# Patient Record
Sex: Female | Born: 1955 | Race: White | Hispanic: No | Marital: Married | Smoking: Former smoker
Health system: Southern US, Community
[De-identification: ages and names within clinical notes are randomized; demographics above are authoritative.]

## PROBLEM LIST (undated history)

## (undated) DIAGNOSIS — R42 Dizziness and giddiness: Secondary | ICD-10-CM

## (undated) DIAGNOSIS — Z720 Tobacco use: Secondary | ICD-10-CM

## (undated) DIAGNOSIS — I10 Essential (primary) hypertension: Secondary | ICD-10-CM

## (undated) DIAGNOSIS — R011 Cardiac murmur, unspecified: Secondary | ICD-10-CM

## (undated) DIAGNOSIS — I00 Rheumatic fever without heart involvement: Secondary | ICD-10-CM

## (undated) DIAGNOSIS — I341 Nonrheumatic mitral (valve) prolapse: Secondary | ICD-10-CM

## (undated) DIAGNOSIS — J449 Chronic obstructive pulmonary disease, unspecified: Secondary | ICD-10-CM

## (undated) DIAGNOSIS — N879 Dysplasia of cervix uteri, unspecified: Secondary | ICD-10-CM

## (undated) DIAGNOSIS — E079 Disorder of thyroid, unspecified: Secondary | ICD-10-CM

## (undated) HISTORY — DX: Rheumatic fever without heart involvement: I00

## (undated) HISTORY — PX: BREAST BIOPSY: SHX20

## (undated) HISTORY — DX: Tobacco use: Z72.0

## (undated) HISTORY — DX: Nonrheumatic mitral (valve) prolapse: I34.1

## (undated) HISTORY — DX: Disorder of thyroid, unspecified: E07.9

## (undated) HISTORY — PX: CYSTECTOMY: SUR359

## (undated) HISTORY — DX: Essential (primary) hypertension: I10

## (undated) HISTORY — DX: Dysplasia of cervix uteri, unspecified: N87.9

## (undated) HISTORY — DX: Dizziness and giddiness: R42

## (undated) HISTORY — DX: Cardiac murmur, unspecified: R01.1

## (undated) HISTORY — DX: Chronic obstructive pulmonary disease, unspecified: J44.9

---
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
## 2015-02-25 ENCOUNTER — Ambulatory Visit (INDEPENDENT_AMBULATORY_CARE_PROVIDER_SITE_OTHER): Payer: Managed Care, Other (non HMO) | Admitting: Primary Care

## 2015-02-25 ENCOUNTER — Encounter (INDEPENDENT_AMBULATORY_CARE_PROVIDER_SITE_OTHER): Payer: Self-pay

## 2015-02-25 ENCOUNTER — Encounter: Payer: Self-pay | Admitting: Primary Care

## 2015-02-25 VITALS — BP 126/80 | HR 106 | Temp 98.7°F | Ht 66.5 in | Wt 159.0 lb

## 2015-02-25 DIAGNOSIS — Z720 Tobacco use: Secondary | ICD-10-CM

## 2015-02-25 DIAGNOSIS — R5383 Other fatigue: Secondary | ICD-10-CM | POA: Insufficient documentation

## 2015-02-25 DIAGNOSIS — E039 Hypothyroidism, unspecified: Secondary | ICD-10-CM | POA: Diagnosis not present

## 2015-02-25 DIAGNOSIS — I10 Essential (primary) hypertension: Secondary | ICD-10-CM | POA: Diagnosis not present

## 2015-02-25 DIAGNOSIS — Z0000 Encounter for general adult medical examination without abnormal findings: Secondary | ICD-10-CM | POA: Diagnosis not present

## 2015-02-25 DIAGNOSIS — G4733 Obstructive sleep apnea (adult) (pediatric): Secondary | ICD-10-CM

## 2015-02-25 DIAGNOSIS — H8110 Benign paroxysmal vertigo, unspecified ear: Secondary | ICD-10-CM

## 2015-02-25 MED ORDER — LISINOPRIL 20 MG PO TABS: 20.0000 mg | ORAL_TABLET | Freq: Every day | ORAL | Status: DC

## 2015-02-25 MED ORDER — LEVOTHYROXINE SODIUM 25 MCG PO TABS: 25.0000 ug | ORAL_TABLET | Freq: Every day | ORAL | Status: DC

## 2015-02-25 MED ORDER — BUPROPION HCL ER (SR) 150 MG PO TB12: 150.0000 mg | ORAL_TABLET | Freq: Two times a day (BID) | ORAL | Status: DC

## 2015-02-25 NOTE — Progress Notes (Signed)
Pre visit review using our clinic review tool, if applicable. No additional management support is needed unless otherwise documented below in the visit note. 

## 2015-02-25 NOTE — Assessment & Plan Note (Signed)
Currently smoking [employer] Cigars and has since April 2015. She plans on quitting once she returns from her upcoming vacation. Refilled Wellbutrin to assist with this process. Spent 5 min discussing importance of quitting. Will continue to monitor.

## 2015-02-25 NOTE — Assessment & Plan Note (Signed)
I suspect this is OSA. She's been notified of this in the past. Will confirm diagnosis once she has completed her sleep study. Sleep study referral made.

## 2015-02-25 NOTE — Assessment & Plan Note (Signed)
Stable without recent dizziness. Uses Meclizine PRN. Will refer to Vestibular rehab if symptoms worsen or become bothersome.

## 2015-02-25 NOTE — Patient Instructions (Signed)
You will be contacted regarding your referrals for the sleep study and for your mammogram. Continue your efforts to quit smoking. Schedule a fasting physical with me in the next 1-2 months. Welcome to [facility]!

## 2015-02-25 NOTE — Assessment & Plan Note (Signed)
Stable on Levothyroxine 54mcg without symptoms. Will obtain old records and new TSH at her upcoming physical.

## 2015-02-25 NOTE — Progress Notes (Signed)
Subjective:    Patient ID: Shaneisha Burkel, female    DOB: Apr 27, 1956, 59 y.o.   MRN: [id number]  HPI  Ms. Pendelton is a 59 year old female who presents today to establish care and discuss the problems mentioned below. Will obtain old records. She moved here from [place] August of 2015 due to a job relocation.    1) Hypothyroidism: Diagnosed in 2003, and taking levothyroxine 54mcg. She's been on this dose consistently for years. Her last TSH was checked during her physical last year, thyroid remains intact anatomically.  2) Hypertension: Diagnosed about 12-15 years ago. She's been on lisinopril 20mg  consistently for numerous years without complications. Eats healthy diet mostly consisting of lean meats, fruits and vegetables. She and her husband prepare most meals and she rarely eats fast food. She is currently not exercising. BP today listed below.  BP Readings from Last 3 Encounters:  02/25/15 126/80   3) Tobacco abuse: Smoked 1-2 PPD of cigarettes for 30 years, and quit 12 years ago. She started smoking swisher sweets cigars since April 2015 during her transition from [place] to [facility]. She is currently taking Wellbutrin SR to help with smoking cessation. She plans on quitting once she returns from her vacation in the [place].   4) Vertigo: Has had 2 episodes to date. She was treated at an Urgent Care with Meclizine and referred to ENT who she saw once. Her symptoms are mild to date and have not progressed.    5) Tiredness: Reports feeling tired most of the day, and will wake up in the middle of the night. She snores and was supposed to go for a sleep study prior to moving to [place], but was unable to do so. She is now open to a sleep study.  Review of Systems  Constitutional: Positive for fatigue. Negative for unexpected weight change.  HENT: Negative for rhinorrhea.   Respiratory: Positive for cough. Negative for shortness of breath.        Chronic cough x 2 months. Last chest xray one  year ago.  Cardiovascular: Negative for chest pain.  Gastrointestinal: Negative for diarrhea and constipation.  Genitourinary: Negative for dysuria and frequency.  Musculoskeletal: Negative for myalgias and arthralgias.  Skin: Negative for rash.  Neurological: Negative for dizziness and headaches.       2 episodes of vertigo.  Hematological: Negative for adenopathy.  Psychiatric/Behavioral:       Denies anxiety or depression       Past Medical History  Diagnosis Date  . Heart murmur     no longer when she has younger  . Hypertension   . Rheumatic fever     59 years old  . Thyroid disease     History   Social History  . Marital Status: Married    Spouse Name: N/A  . Number of Children: N/A  . Years of Education: N/A   Occupational History  . Not on file.   Social History Main Topics  . Smoking status: Current Every Day Smoker  . Smokeless tobacco: Not on file  . Alcohol Use: No  . Drug Use: No  . Sexual Activity: Not on file   Other Topics Concern  . Not on file   Social History Narrative   Married.   No children.   Moved from [place] last August for job.   Vacations in [place] every year.   Enjoys reading in her free time.    Past Surgical History  Procedure Laterality Date  .  Tonsillectomy  1972    Family History  Problem Relation Age of Onset  . Cancer Father     Lung CA  . Arthritis Maternal Grandmother   . Hypothyroidism Sister   . Glaucoma Mother   . Hypothyroidism Mother     No Known Allergies  No current outpatient prescriptions on file prior to visit.   No current facility-administered medications on file prior to visit.    BP 126/80 mmHg  Pulse 106  Temp(Src) 98.7 F (37.1 C) (Oral)  Ht 5' 6.5" (1.689 m)  Wt 159 lb (72.122 kg)  BMI 25.28 kg/m2  SpO2 95%    Objective:   Physical Exam  Constitutional: She is oriented to person, place, and time. She appears well-developed.  HENT:  Head: Normocephalic.  Right Ear: External ear  normal.  Left Ear: External ear normal.  Nose: Nose normal.  Mouth/Throat: Oropharynx is clear and moist.  Eyes: Conjunctivae and EOM are normal. Pupils are equal, round, and reactive to light.  Neck: Neck supple. No thyromegaly present.  Cardiovascular: Regular rhythm and normal heart sounds.   Pulmonary/Chest: Effort normal and breath sounds normal. She has no wheezes. She has no rales.  Abdominal: Soft. Bowel sounds are normal. There is no tenderness.  Lymphadenopathy:    She has no cervical adenopathy.  Neurological: She is alert and oriented to person, place, and time. No cranial nerve deficit.  Skin: Skin is warm and dry.  Psychiatric: She has a normal mood and affect.          Assessment & Plan:  5 min spent discussing tobacco cessation

## 2015-02-25 NOTE — Assessment & Plan Note (Signed)
Stable on Lisinopril 20mg . Eats a healthy diet, discussed exercise. Will continue to monitor. Will obtain BMET from prior PCP.

## 2015-02-26 ENCOUNTER — Telehealth: Payer: Self-pay | Admitting: Primary Care

## 2015-02-26 NOTE — Telephone Encounter (Signed)
emmi emailed °

## 2015-02-26 NOTE — Telephone Encounter (Signed)
emmi mailed  °

## 2015-03-17 ENCOUNTER — Encounter: Payer: Self-pay | Admitting: Primary Care

## 2015-04-01 ENCOUNTER — Encounter: Payer: Self-pay | Admitting: Internal Medicine

## 2015-04-06 ENCOUNTER — Ambulatory Visit (INDEPENDENT_AMBULATORY_CARE_PROVIDER_SITE_OTHER): Payer: Managed Care, Other (non HMO) | Admitting: Primary Care

## 2015-04-06 ENCOUNTER — Encounter: Payer: Self-pay | Admitting: Primary Care

## 2015-04-06 VITALS — BP 118/72 | HR 94 | Temp 98.0°F | Ht 67.0 in | Wt 158.8 lb

## 2015-04-06 DIAGNOSIS — I10 Essential (primary) hypertension: Secondary | ICD-10-CM

## 2015-04-06 DIAGNOSIS — Z0000 Encounter for general adult medical examination without abnormal findings: Secondary | ICD-10-CM

## 2015-04-06 DIAGNOSIS — Z720 Tobacco use: Secondary | ICD-10-CM | POA: Diagnosis not present

## 2015-04-06 DIAGNOSIS — Z23 Encounter for immunization: Secondary | ICD-10-CM | POA: Diagnosis not present

## 2015-04-06 DIAGNOSIS — E039 Hypothyroidism, unspecified: Secondary | ICD-10-CM

## 2015-04-06 DIAGNOSIS — E785 Hyperlipidemia, unspecified: Secondary | ICD-10-CM | POA: Insufficient documentation

## 2015-04-06 LAB — CBC WITH DIFFERENTIAL/PLATELET
BASOS ABS: 0 10*3/uL (ref 0.0–0.1)
Basophils Relative: 0.3 % (ref 0.0–3.0)
EOS ABS: 0.1 10*3/uL (ref 0.0–0.7)
Eosinophils Relative: 1.2 % (ref 0.0–5.0)
HCT: 42.7 % (ref 36.0–46.0)
Hemoglobin: 14.3 g/dL (ref 12.0–15.0)
LYMPHS PCT: 16.2 % (ref 12.0–46.0)
Lymphs Abs: 1.8 10*3/uL (ref 0.7–4.0)
MCHC: 33.6 g/dL (ref 30.0–36.0)
MCV: 90.9 fl (ref 78.0–100.0)
MONOS PCT: 3 % (ref 3.0–12.0)
Monocytes Absolute: 0.3 10*3/uL (ref 0.1–1.0)
NEUTROS ABS: 8.8 10*3/uL — AB (ref 1.4–7.7)
Neutrophils Relative %: 79.3 % — ABNORMAL HIGH (ref 43.0–77.0)
PLATELETS: 359 10*3/uL (ref 150.0–400.0)
RBC: 4.7 Mil/uL (ref 3.87–5.11)
RDW: 14.6 % (ref 11.5–15.5)
WBC: 11.1 10*3/uL — AB (ref 4.0–10.5)

## 2015-04-06 LAB — LIPID PANEL
Cholesterol: 225 mg/dL — ABNORMAL HIGH (ref 0–200)
HDL: 49 mg/dL (ref >39.00)
LDL Cholesterol: 149 mg/dL — ABNORMAL HIGH (ref 0–99)
NonHDL: 176
TRIGLYCERIDES: 133 mg/dL (ref 0.0–149.0)
Total CHOL/HDL Ratio: 5
VLDL: 26.6 mg/dL (ref 0.0–40.0)

## 2015-04-06 LAB — COMPREHENSIVE METABOLIC PANEL
ALK PHOS: 82 U/L (ref 39–117)
ALT: 14 U/L (ref 0–35)
AST: 15 U/L (ref 0–37)
Albumin: 4.2 g/dL (ref 3.5–5.2)
BILIRUBIN TOTAL: 0.3 mg/dL (ref 0.2–1.2)
BUN: 11 mg/dL (ref 6–23)
CO2: 23 mEq/L (ref 19–32)
CREATININE: 0.72 mg/dL (ref 0.40–1.20)
Calcium: 9.8 mg/dL (ref 8.4–10.5)
Chloride: 107 mEq/L (ref 96–112)
GFR: 88.07 mL/min (ref >60.00)
GLUCOSE: 97 mg/dL (ref 70–99)
Potassium: 4.7 mEq/L (ref 3.5–5.1)
SODIUM: 140 meq/L (ref 135–145)
Total Protein: 7.2 g/dL (ref 6.0–8.3)

## 2015-04-06 LAB — HEMOGLOBIN A1C: Hgb A1c MFr Bld: 5.8 % (ref 4.6–6.5)

## 2015-04-06 LAB — TSH: TSH: 0.83 u[IU]/mL (ref 0.35–4.50)

## 2015-04-06 NOTE — Patient Instructions (Signed)
Complete lab work prior to leaving today. I will notify you of your results. Your cough is likely related to your lisinopril. We can wean you off of this medication. Start taking 1/2 tablet by mouth once daily for one week then stop. You will be contacted regarding your referral to the Dentist and Optometrist.  Please let us know if you have not heard back within one week.  Please schedule your mammogram. Follow up in one month for re-evaluation of your blood pressure. Call me if you develop chest pain, headaches, shortness of breath. It was great to see you!

## 2015-04-06 NOTE — Addendum Note (Signed)
Addended by: Marchia Bond on: 04/06/2015 04:33 PM   Modules accepted: Miquel Dunn

## 2015-04-06 NOTE — Progress Notes (Signed)
Subjective:    Patient ID: Gloria Neal, female    DOB: December 19, 1955, 59 y.o.   MRN: [id number]  HPI  Gloria Neal is a 59 year old female who presents today for complete physical.  Immunizations: -Tetanus: Last TDAP was 2008. -Influenza: Did not have the flu shot last season -Pneumonia: Has never had.    Diet: Overall healthy diet, prepare meals at home mostly. Baked/grilled lean meats, fruits and vegetables. Drinks water. Rare consumption of fast food. Exercise: Currently not exercising. Eye exam: Last one was over 2 years ago in [place]. Would like to establish in [place]. Dental exam: Last visit was in June/July 2015 in [place]. Would like to establish in [place]. Colonoscopy: Completed in 06/2012. Repeat in 10 years. Dexa: Believes it to have been 5-6 years ago. Currently does not take Calcium or vitamin D.  Pap Smear: Last PAP was two years ago. Due in 2017. Mammogram: Referral made last visit. She is working on setting the appointment.  1) Tobacco abuse: Currently smokes [employer] Cigars. Smokes 2-3 per day. She and her husband are talking about quitting. She is taking Wellbutrin SR 150 daily. Denies shortness of breath, hemoptysis. Chest xray one year ago was negative.  2) Cough: Present for three months, non-productive, dry/tickle cough that is persistent. Chest xray one year ago and was normal. Denies hemoptysis.   Review of Systems  Constitutional: Negative for fatigue and unexpected weight change.  HENT: Negative for congestion, rhinorrhea and sore throat.   Respiratory: Positive for cough. Negative for shortness of breath.   Cardiovascular: Negative for chest pain and leg swelling.  Gastrointestinal: Negative for diarrhea, constipation and blood in stool.  Genitourinary: Negative for dysuria, frequency and difficulty urinating.  Allergic/Immunologic: Positive for environmental allergies.  Neurological: Negative for dizziness, numbness and headaches.  Hematological: Negative for  adenopathy.  Psychiatric/Behavioral:       Denies concerns for anxiety or depression       Past Medical History  Diagnosis Date  . Heart murmur     no longer when she has younger  . Hypertension   . Rheumatic fever     60 years old  . Thyroid disease   . Tobacco abuse   . Vertigo   . Mitral valve prolapse   . Cervical dysplasia     History   Social History  . Marital Status: Married    Spouse Name: N/A  . Number of Children: N/A  . Years of Education: N/A   Occupational History  . Not on file.   Social History Main Topics  . Smoking status: Current Every Day Smoker  . Smokeless tobacco: Not on file  . Alcohol Use: No  . Drug Use: No  . Sexual Activity: Not on file   Other Topics Concern  . Not on file   Social History Narrative   Married.   No children.   Moved from [place] last August for job.   Vacations in [place] every year.   Enjoys reading in her free time.    Past Surgical History  Procedure Laterality Date  . Tonsillectomy  1972    Family History  Problem Relation Age of Onset  . Cancer Father     Lung CA  . Arthritis Maternal Grandmother   . Hypothyroidism Sister   . Glaucoma Mother   . Hypothyroidism Mother     No Known Allergies  Current Outpatient Prescriptions on File Prior to Visit  Medication Sig Dispense Refill  . buPROPion [facility]  SR) 150 MG 12 hr tablet Take 1 tablet (150 mg total) by mouth 2 (two) times daily. 60 tablet 3  . levothyroxine (SYNTHROID, LEVOTHROID) 25 MCG tablet Take 1 tablet (25 mcg total) by mouth daily before breakfast. 30 tablet 5  . lisinopril (PRINIVIL,ZESTRIL) 20 MG tablet Take 1 tablet (20 mg total) by mouth daily. 30 tablet 5   No current facility-administered medications on file prior to visit.    BP 118/72 mmHg  Pulse 94  Temp(Src) 98 F (36.7 C) (Oral)  Ht 5\' 7"  (1.702 m)  Wt 158 lb 12.8 oz (72.031 kg)  BMI 24.87 kg/m2  SpO2 98%    Objective:   Physical Exam  Constitutional: She is oriented  to person, place, and time. She appears well-developed.  HENT:  Right Ear: Tympanic membrane and ear canal normal.  Left Ear: Tympanic membrane and ear canal normal.  Nose: Nose normal.  Mouth/Throat: Oropharynx is clear and moist.  Eyes: Conjunctivae and EOM are normal. Pupils are equal, round, and reactive to light.  Neck: Neck supple. No thyromegaly present.  Cardiovascular: Normal rate.   Pulmonary/Chest: Effort normal and breath sounds normal.  Abdominal: Soft. Bowel sounds are normal. She exhibits no mass. There is no tenderness.  Musculoskeletal: Normal range of motion.  Lymphadenopathy:    She has no cervical adenopathy.  Neurological: She is alert and oriented to person, place, and time. She has normal reflexes. No cranial nerve deficit. Coordination normal.  Skin: Skin is warm and dry. No rash noted.  Psychiatric: She has a normal mood and affect.          Assessment & Plan:  Discussed the importance of tobacco cessation for 5 min during visit.

## 2015-04-06 NOTE — Assessment & Plan Note (Signed)
Taking Levothyroxine 25 mcg. TSH today.

## 2015-04-06 NOTE — Assessment & Plan Note (Addendum)
Still smoking swisher sweets cigars. She and her husband are talking about quitting. Discussed importance of quitting, especially given her FH of lung cancer. Cough present for 3 months, likely due to lisinopril. If still present after removal of medication, will consider chest xray. Will continue to monitor and discuss.

## 2015-04-06 NOTE — Assessment & Plan Note (Signed)
Taking lisinopril 20mg  since early 2000's. Development of dry, tickle, cough that has been persistent for 3 months.  BP stable today, she follows a healthy diet. Will wean off to determine the cause of cough and to monitor BP off medication. Follow up in one month for re-evaluation.

## 2015-04-06 NOTE — Assessment & Plan Note (Signed)
Tetanus up-to-date. Needs pneumonia vaccine due to risk factors of smoking. Administered Pneumovax. Overall healthy diet. Is thinking about quitting smoking. Pap due next year. Working on mammogram appointment, referral made last visit. Encouraged intake of calcium with vitamin D. Needs Dexa scan due to r/f of smoking.

## 2015-04-06 NOTE — Progress Notes (Signed)
Pre visit review using our clinic review tool, if applicable. No additional management support is needed unless otherwise documented below in the visit note. 

## 2015-04-07 ENCOUNTER — Encounter: Payer: Self-pay | Admitting: *Deleted

## 2015-04-09 ENCOUNTER — Telehealth: Payer: Self-pay | Admitting: Primary Care

## 2015-04-09 ENCOUNTER — Other Ambulatory Visit: Payer: Self-pay | Admitting: Primary Care

## 2015-04-09 DIAGNOSIS — Z780 Asymptomatic menopausal state: Secondary | ICD-10-CM

## 2015-04-09 NOTE — Telephone Encounter (Signed)
Gloria Neal i called [facility] to schedule Gloria Neal's bone density They need it changed to post menopausal or vit d dificiency Can you change order

## 2015-04-09 NOTE — Telephone Encounter (Signed)
Done, thanks

## 2015-04-09 NOTE — Telephone Encounter (Signed)
Left message asking pt to call office Please let pt know her mammogram and bone density is Monday 04/27/15 [facility] arrive @ 2:40 appointment at 3  Pt needs to get previous records sent to [facility] [address] [place] [address] Phone # [phone]

## 2015-04-10 NOTE — Telephone Encounter (Signed)
Left message asking pt to call office  °

## 2015-04-13 NOTE — Telephone Encounter (Signed)
Left message asking pt to call office  °

## 2015-04-14 NOTE — Telephone Encounter (Signed)
Called and spoken to patient. Notified her of the apt of mammogram and bone density. Patient verbalized understanding.

## 2015-04-14 NOTE — Addendum Note (Signed)
Addended by: Jacqualin Combes on: 04/14/2015 09:11 AM   Modules accepted: Orders

## 2015-04-16 ENCOUNTER — Ambulatory Visit (INDEPENDENT_AMBULATORY_CARE_PROVIDER_SITE_OTHER): Payer: Managed Care, Other (non HMO) | Admitting: Pulmonary Disease

## 2015-04-16 ENCOUNTER — Encounter (INDEPENDENT_AMBULATORY_CARE_PROVIDER_SITE_OTHER): Payer: Self-pay

## 2015-04-16 ENCOUNTER — Encounter: Payer: Self-pay | Admitting: Pulmonary Disease

## 2015-04-16 VITALS — BP 142/86 | HR 110 | Ht 67.0 in | Wt 159.6 lb

## 2015-04-16 DIAGNOSIS — G4733 Obstructive sleep apnea (adult) (pediatric): Secondary | ICD-10-CM

## 2015-04-16 DIAGNOSIS — Z720 Tobacco use: Secondary | ICD-10-CM | POA: Diagnosis not present

## 2015-04-16 NOTE — Patient Instructions (Signed)
Will arrange for sleep study Will call to arrange for follow up after sleep study reviewed 

## 2015-04-16 NOTE — Progress Notes (Deleted)
   Subjective:    Patient ID: Gloria Neal, female    DOB: [birth year], 59 y.o.   MRN: [id number]  HPI    Review of Systems  Constitutional: Negative for fever and unexpected weight change.  HENT: Negative for congestion, dental problem, ear pain, nosebleeds, postnasal drip, rhinorrhea, sinus pressure, sneezing, sore throat and trouble swallowing.   Eyes: Negative for redness and itching.  Respiratory: Positive for cough. Negative for chest tightness, shortness of breath and wheezing.   Cardiovascular: Negative for palpitations and leg swelling.  Gastrointestinal: Negative for nausea and vomiting.  Genitourinary: Negative for dysuria.  Musculoskeletal: Negative for joint swelling.  Skin: Negative for rash.  Neurological: Negative for headaches.  Hematological: Does not bruise/bleed easily.  Psychiatric/Behavioral: Negative for dysphoric mood. The patient is not nervous/anxious.        Objective:   Physical Exam        Assessment & Plan:

## 2015-04-16 NOTE — Progress Notes (Signed)
Chief Complaint  Patient presents with  . SLEEP CONSULT    Referred by [name] NP. Sleep study in [place] several years ago(10+ yrs). Pt does not have copy of this. Epworth Score: 7    History of Present Illness: Gloria Neal is a 59 y.o. female for evaluation of sleep problems.  She was diagnosed with sleep apnea while living in [place] about 10 yrs ago.  She was advised to f/u with ENT, but never did.  She has noticed more trouble with staying asleep and daytime fatigue.  She is a loud snorer, and her husband sleeps in a separate room.  She can't sleep on her back.  Her mouth gets very dry at night and she has to wake up to drink water.  She goes to sleep at 9 pm.  She falls asleep quickly.  She wakes up 5 to 6 times to use the bathroom.  She gets out of bed at 515 am.  She feels tired in the morning.  She sometimes gets morning headache.  She does not use anything to help her fall sleep.  She drinks one cup of coffee in the morning.  She denies sleep walking, sleep talking, bruxism, or nightmares.  There is no history of restless legs.  She denies sleep hallucinations, sleep paralysis, or cataplexy.  The Epworth score is 7 out of 24.  Gloria Neal  has a past medical history of Heart murmur; Hypertension; Rheumatic fever; Thyroid disease; Tobacco abuse; Vertigo; Mitral valve prolapse; and Cervical dysplasia.  Gloria Neal  has past surgical history that includes Tonsillectomy (1972) and Cystectomy.  Prior to Admission medications   Medication Sig Start Date End Date Taking? Authorizing Provider  buPROPion (WELLBUTRIN SR) 150 MG 12 hr tablet Take 1 tablet (150 mg total) by mouth 2 (two) times daily. 02/25/15  Yes Pleas [name], NP  levothyroxine (SYNTHROID, LEVOTHROID) 25 MCG tablet Take 1 tablet (25 mcg total) by mouth daily before breakfast. 02/25/15  Yes Pleas [name], NP  lisinopril (PRINIVIL,ZESTRIL) 20 MG tablet Take 1 tablet (20 mg total) by mouth  daily. Patient not taking: Reported on 04/16/2015 02/25/15   Pleas [name], NP    No Known Allergies  Her family history includes Arthritis in her maternal grandmother; Cancer in her father; Glaucoma in her mother; Hypothyroidism in her mother and sister.  She  reports that she has been smoking Cigars.  She does not have any smokeless tobacco history on file. She reports that she drinks alcohol. She reports that she does not use illicit drugs.   Review of Systems  Constitutional: Negative for fever and unexpected weight change.  HENT: Negative for congestion, dental problem, ear pain, nosebleeds, postnasal drip, rhinorrhea, sinus pressure, sneezing, sore throat and trouble swallowing.   Eyes: Negative for redness and itching.  Respiratory: Positive for cough. Negative for chest tightness, shortness of breath and wheezing.   Cardiovascular: Negative for palpitations and leg swelling.  Gastrointestinal: Negative for nausea and vomiting.  Genitourinary: Negative for dysuria.  Musculoskeletal: Negative for joint swelling.  Skin: Negative for rash.  Neurological: Negative for headaches.  Hematological: Does not bruise/bleed easily.  Psychiatric/Behavioral: Negative for dysphoric mood. The patient is not nervous/anxious.    Physical Exam: Blood pressure 142/86, pulse 110, height 5\' 7"  (1.702 m), weight 159 lb 9.6 oz (72.394 kg), SpO2 96 %. Body mass index is 24.99 kg/(m^2).  General - No distress ENT - No sinus tenderness, no oral exudate, no LAN, no thyromegaly, TM clear,  pupils equal/reactive, MP 3, slight over bite Cardiac - s1s2 regular, no murmur, pulses symmetric Chest - No wheeze/rales/dullness, good air entry, normal respiratory excursion Back - No focal tenderness Abd - Soft, non-tender, no organomegaly, + bowel sounds Ext - No edema, no clubbing Neuro - Normal strength, cranial nerves intact Skin - No rashes Psych - Normal mood, and behavior  Discussion: She has snoring,  sleep disruption, and daytime sleepiness.  She has hx of HTN.  She has prior hx of OSA.  I am concerned she still has sleep apnea.  We discussed how sleep apnea can affect various health problems including risks for hypertension, cardiovascular disease, and diabetes.  We also discussed how sleep disruption can increase risks for accident, such as while driving.  Weight loss as a means of improving sleep apnea was also reviewed.  Additional treatment options discussed were CPAP therapy, oral appliance, and surgical intervention.   Assessment/plan:  Obstructive sleep apnea. Plan: - will arrange for in lab sleep study - if she still has sleep apnea, then oral appliance might be good option for her  Tobacco abuse. Plan: - encouraged her to continue with plan for smoking cessation with wellbutrin   Chesley Mires, M.D. Pager [phone]

## 2015-04-27 ENCOUNTER — Ambulatory Visit
Admission: RE | Admit: 2015-04-27 | Discharge: 2015-04-27 | Disposition: A | Discharge status: Home / Self Care | Payer: Managed Care, Other (non HMO) | Source: Ambulatory Visit | Attending: Primary Care | Admitting: Primary Care

## 2015-04-27 DIAGNOSIS — Z0000 Encounter for general adult medical examination without abnormal findings: Secondary | ICD-10-CM

## 2015-04-27 DIAGNOSIS — Z780 Asymptomatic menopausal state: Secondary | ICD-10-CM

## 2015-04-28 ENCOUNTER — Encounter: Payer: Self-pay | Admitting: *Deleted

## 2015-05-07 ENCOUNTER — Ambulatory Visit (INDEPENDENT_AMBULATORY_CARE_PROVIDER_SITE_OTHER): Payer: Managed Care, Other (non HMO) | Admitting: Primary Care

## 2015-05-07 ENCOUNTER — Encounter: Payer: Self-pay | Admitting: Primary Care

## 2015-05-07 VITALS — BP 126/82 | HR 87 | Temp 98.2°F | Ht 67.0 in | Wt 162.1 lb

## 2015-05-07 DIAGNOSIS — I10 Essential (primary) hypertension: Secondary | ICD-10-CM

## 2015-05-07 DIAGNOSIS — Z720 Tobacco use: Secondary | ICD-10-CM

## 2015-05-07 NOTE — Assessment & Plan Note (Signed)
She stopped smoking on 04/20/15. She is taking daily Wellbutrin which is helping with cravings. Commended her on her success! Will closely monitor.

## 2015-05-07 NOTE — Assessment & Plan Note (Signed)
Stable thus far off of Lisinopril. No cough present. Overall feels well with some headaches. One elevated reading during pulmonology consult. Will continue to closely monitor.   BP Readings from Last 3 Encounters:  05/07/15 126/82  04/16/15 142/86  04/06/15 118/72

## 2015-05-07 NOTE — Patient Instructions (Addendum)
Purchase a blood pressure cuff and check your blood pressure once every 3 days. Take note of your readings and notify me if you get readings consistently over 140/90. Healthy diet and exercise will help to keep your pressures maintained. Call me if your headaches worsen. Congratulations on smoking cessation!   Follow up in 2 months for blood pressure re-check.

## 2015-05-07 NOTE — Progress Notes (Signed)
Subjective:    Patient ID: Gloria Neal, female    DOB: [birth year], 59 y.o.   MRN: [id number]  HPI  Gloria Neal is a 59 year old female who presents today for follow up of hypertension. During last visit she was reporting a dry cough present for the past 3 months. She was taking lisinopril 20 mg since early 2000's. She was removed from this medication last visit to determine if this was the cause of her cough. Since last visit she's felt well with some headaches, but also needs a new glasses prescription. She's had one elevated reading at her pulmonary visit of 142/86. Her blood pressure in clinic today is stable. She has not experienced a cough since removal of lisinopril.  BP Readings from Last 3 Encounters:  05/07/15 126/82  04/16/15 142/86  04/06/15 118/72     2) Tobacco cessation: She stopped smoking swisher sweets on Monday May 16th at 3:30pm. She has not had a cigar or cigarette since. She continues to take her Wellbutrin daily which helps curve cravings. She's feeling good about her decision and is motivated to not smoke. She and her husband both quit together.  Review of Systems  Respiratory: Negative for cough and shortness of breath.   Cardiovascular: Negative for chest pain.  Neurological: Positive for headaches.       Past Medical History  Diagnosis Date  . Heart murmur     no longer when she has younger  . Hypertension   . Rheumatic fever     59 years old  . Thyroid disease   . Tobacco abuse   . Vertigo   . Mitral valve prolapse   . Cervical dysplasia     History   Social History  . Marital Status: Married    Spouse Name: N/A  . Number of Children: N/A  . Years of Education: N/A   Occupational History  . Accts Payable    Social History Main Topics  . Smoking status: Former Smoker    Types: Cigars    Quit date: 04/20/2015  . Smokeless tobacco: Not on file     Comment: 2 cigars a day  . Alcohol Use: 0.0 oz/week    0 Standard drinks or equivalent  per week     Comment: occassional  . Drug Use: No  . Sexual Activity: Not on file   Other Topics Concern  . Not on file   Social History Narrative   Married.   No children.   Moved from [place] last August for job.   Vacations in [place] every year.   Enjoys reading in her free time.    Past Surgical History  Procedure Laterality Date  . Tonsillectomy  1972  . Cystectomy      right brow and buttocks    Family History  Problem Relation Age of Onset  . Cancer Father     Lung CA  . Arthritis Maternal Grandmother   . Hypothyroidism Sister   . Glaucoma Mother   . Hypothyroidism Mother     No Known Allergies  Current Outpatient Prescriptions on File Prior to Visit  Medication Sig Dispense Refill  . buPROPion (WELLBUTRIN SR) 150 MG 12 hr tablet Take 1 tablet (150 mg total) by mouth 2 (two) times daily. 60 tablet 3  . levothyroxine (SYNTHROID, LEVOTHROID) 25 MCG tablet Take 1 tablet (25 mcg total) by mouth daily before breakfast. 30 tablet 5  . lisinopril (PRINIVIL,ZESTRIL) 20 MG tablet Take 1 tablet (20 mg total)  by mouth daily. 30 tablet 5   No current facility-administered medications on file prior to visit.    BP 126/82 mmHg  Pulse 87  Temp(Src) 98.2 F (36.8 C) (Oral)  Ht 5\' 7"  (1.702 m)  Wt 162 lb 1.9 oz (73.537 kg)  BMI 25.39 kg/m2  SpO2 98%    Objective:   Physical Exam  Cardiovascular: Normal rate and regular rhythm.   Pulmonary/Chest: Effort normal and breath sounds normal.  Skin: Skin is warm and dry.          Assessment & Plan:

## 2015-05-07 NOTE — Progress Notes (Signed)
Pre visit review using our clinic review tool, if applicable. No additional management support is needed unless otherwise documented below in the visit note. 

## 2015-05-08 ENCOUNTER — Encounter: Payer: Self-pay | Admitting: *Deleted

## 2015-06-26 ENCOUNTER — Encounter (HOSPITAL_BASED_OUTPATIENT_CLINIC_OR_DEPARTMENT_OTHER): Payer: Managed Care, Other (non HMO)

## 2015-07-07 ENCOUNTER — Encounter: Payer: Self-pay | Admitting: Primary Care

## 2015-07-07 ENCOUNTER — Ambulatory Visit (INDEPENDENT_AMBULATORY_CARE_PROVIDER_SITE_OTHER): Payer: Managed Care, Other (non HMO) | Admitting: Primary Care

## 2015-07-07 VITALS — BP 138/84 | HR 78 | Temp 98.1°F | Ht 67.0 in | Wt 167.8 lb

## 2015-07-07 DIAGNOSIS — Z720 Tobacco use: Secondary | ICD-10-CM

## 2015-07-07 DIAGNOSIS — R21 Rash and other nonspecific skin eruption: Secondary | ICD-10-CM | POA: Diagnosis not present

## 2015-07-07 DIAGNOSIS — I10 Essential (primary) hypertension: Secondary | ICD-10-CM | POA: Diagnosis not present

## 2015-07-07 MED ORDER — TRIAMCINOLONE ACETONIDE 0.1 % EX CREA: 1.0000 "application " | TOPICAL_CREAM | Freq: Two times a day (BID) | CUTANEOUS | Status: DC

## 2015-07-07 NOTE — Progress Notes (Signed)
Subjective:    Patient ID: Gloria Neal, female    DOB: 05-Jan-1956, 59 y.o.   MRN: [id number]  HPI  Gloria Neal is a 59 year old female who presents today for follow up of hypertension. She was evaluated on 05/07/15 after removing her lisinopril 20 mg dose that she had been taking since early 2000's due to nagging, dry cough. She was never switched to another medication as her BP had been well managed and she wanted to trial being off of her medication. Last visit her blood pressure was stable off the medication.   Since her last visit her blood pressure. She has not checked her blood pressure in several weeks but 2 weeks ago she endorses a BP of 115's-120's/80's. She's recently experiencing a lot of stress with her husband going through surgery this week. She denies headaches, chest pain, blurred vision. Blood pressure rechecked after patient was resting and was 138/84.  BP Readings from Last 3 Encounters:  07/07/15 152/92  05/07/15 126/82  04/16/15 142/86     2) Tobacco cessation: She stopped smoking swisher sweets on Monday May 16th. Last visit she had been doing well and had not had a cigar. She is managed on Wellbutrin daily that has helped with cravings. She's continued to refrain from smoking and is very determined never to smoke again.  3) Rash: Present for the past 2 months and is located to the right anterior lower extremity. She's tried applying [place] without relief. No improvement in rash and believes it may be moving up her leg. Some itching, some tenderness. No recent contact with poison ivy.  Review of Systems  Eyes: Negative for visual disturbance.  Respiratory: Negative for cough and shortness of breath.   Cardiovascular: Negative for chest pain.  Skin: Positive for rash.  Neurological: Negative for dizziness and headaches.       Past Medical History  Diagnosis Date  . Heart murmur     no longer when she has younger  . Hypertension   . Rheumatic fever     59  years old  . Thyroid disease   . Tobacco abuse   . Vertigo   . Mitral valve prolapse   . Cervical dysplasia     History   Social History  . Marital Status: Married    Spouse Name: N/A  . Number of Children: N/A  . Years of Education: N/A   Occupational History  . Accts Payable    Social History Main Topics  . Smoking status: Former Smoker    Types: Cigars    Quit date: 04/20/2015  . Smokeless tobacco: Not on file     Comment: 2 cigars a day  . Alcohol Use: 0.0 oz/week    0 Standard drinks or equivalent per week     Comment: occassional  . Drug Use: No  . Sexual Activity: Not on file   Other Topics Concern  . Not on file   Social History Narrative   Married.   No children.   Moved from [place] last August for job.   Vacations in [place] every year.   Enjoys reading in her free time.    Past Surgical History  Procedure Laterality Date  . Tonsillectomy  1972  . Cystectomy      right brow and buttocks    Family History  Problem Relation Age of Onset  . Cancer Father     Lung CA  . Arthritis Maternal Grandmother   . Hypothyroidism Sister   .  Glaucoma Mother   . Hypothyroidism Mother     No Known Allergies  Current Outpatient Prescriptions on File Prior to Visit  Medication Sig Dispense Refill  . buPROPion (WELLBUTRIN SR) 150 MG 12 hr tablet Take 1 tablet (150 mg total) by mouth 2 (two) times daily. 60 tablet 3  . levothyroxine (SYNTHROID, LEVOTHROID) 25 MCG tablet Take 1 tablet (25 mcg total) by mouth daily before breakfast. 30 tablet 5  . lisinopril (PRINIVIL,ZESTRIL) 20 MG tablet Take 1 tablet (20 mg total) by mouth daily. (Patient not taking: Reported on 07/07/2015) 30 tablet 5   No current facility-administered medications on file prior to visit.    BP 138/84 mmHg  Pulse 78  Temp(Src) 98.1 F (36.7 C) (Oral)  Ht 5\' 7"  (1.702 m)  Wt 167 lb 12.8 oz (76.114 kg)  BMI 26.28 kg/m2  SpO2 97%    Objective:   Physical Exam  Cardiovascular: Normal rate  and regular rhythm.   Pulmonary/Chest: Effort normal and breath sounds normal.  Skin: Skin is warm and dry.  Rash to right anterior lower extremity measuring 5 cm in length. Skin intact.          Assessment & Plan:  Rash:  Appears to be eczema, located to right anterior lower extremity. RX for triamcinolone cream BID. Follow up PRN.

## 2015-07-07 NOTE — Assessment & Plan Note (Signed)
Continued not smoking since quit date of May 16th. Commended her on this accomplishment. Will continue to monitor.

## 2015-07-07 NOTE — Progress Notes (Signed)
Pre visit review using our clinic review tool, if applicable. No additional management support is needed unless otherwise documented below in the visit note. 

## 2015-07-07 NOTE — Assessment & Plan Note (Addendum)
Initially elevated at 152/92, rechecked after patient had been resting and was 138/84. She's checking her BP at home, last check 2 weeks ago, BP running 115's-120's/80's. Denies chest pain, SOB, headaches, changes in vision. She is under stress currently due to her husbands upcoming surgery. Will continue to closely monitor as she is to record her readings and send them to me on Mychart.

## 2015-07-07 NOTE — Patient Instructions (Signed)
Check your blood pressure at home daily, record, and send them to me on My Chart for the next 2 weeks.  I would like to re-evaluate your blood pressure in 6 weeks to ensure it's well controlled off the Lisinopril.  You may apply the triamcinolone cream to your rash twice daily.  It was nice to see you!

## 2015-07-19 ENCOUNTER — Encounter: Payer: Self-pay | Admitting: Primary Care

## 2015-07-26 ENCOUNTER — Ambulatory Visit (HOSPITAL_BASED_OUTPATIENT_CLINIC_OR_DEPARTMENT_OTHER): Payer: Managed Care, Other (non HMO) | Attending: Pulmonary Disease

## 2015-07-26 VITALS — Ht 67.0 in | Wt 160.0 lb

## 2015-07-26 DIAGNOSIS — I119 Hypertensive heart disease without heart failure: Secondary | ICD-10-CM

## 2015-07-26 DIAGNOSIS — I10 Essential (primary) hypertension: Secondary | ICD-10-CM | POA: Insufficient documentation

## 2015-07-26 DIAGNOSIS — R5383 Other fatigue: Secondary | ICD-10-CM | POA: Diagnosis present

## 2015-07-26 DIAGNOSIS — G4733 Obstructive sleep apnea (adult) (pediatric): Secondary | ICD-10-CM | POA: Diagnosis not present

## 2015-08-19 ENCOUNTER — Ambulatory Visit (INDEPENDENT_AMBULATORY_CARE_PROVIDER_SITE_OTHER): Payer: Managed Care, Other (non HMO) | Admitting: Primary Care

## 2015-08-19 ENCOUNTER — Encounter: Payer: Self-pay | Admitting: Primary Care

## 2015-08-19 ENCOUNTER — Telehealth: Payer: Self-pay | Admitting: Pulmonary Disease

## 2015-08-19 VITALS — BP 132/80 | HR 65 | Temp 98.0°F | Ht 67.0 in | Wt 171.1 lb

## 2015-08-19 DIAGNOSIS — E039 Hypothyroidism, unspecified: Secondary | ICD-10-CM | POA: Diagnosis not present

## 2015-08-19 DIAGNOSIS — Z720 Tobacco use: Secondary | ICD-10-CM | POA: Diagnosis not present

## 2015-08-19 DIAGNOSIS — I10 Essential (primary) hypertension: Secondary | ICD-10-CM | POA: Diagnosis not present

## 2015-08-19 DIAGNOSIS — G4733 Obstructive sleep apnea (adult) (pediatric): Secondary | ICD-10-CM | POA: Diagnosis not present

## 2015-08-19 MED ORDER — LEVOTHYROXINE SODIUM 25 MCG PO TABS: 25.0000 ug | ORAL_TABLET | Freq: Every day | ORAL | Status: DC

## 2015-08-19 NOTE — Telephone Encounter (Signed)
PSG 07/26/15 >> AHI 3.5, SaO2 low 89%, REM AHI 20.2   Will have my nurse schedule ROV to discuss sleep study results.

## 2015-08-19 NOTE — Assessment & Plan Note (Signed)
She continues not to smoke. Commended her on this achievement.

## 2015-08-19 NOTE — Progress Notes (Signed)
Pre visit review using our clinic review tool, if applicable. No additional management support is needed unless otherwise documented below in the visit note. 

## 2015-08-19 NOTE — Progress Notes (Signed)
Subjective:    Patient ID: Gloria Neal, female    DOB: [birth year], 59 y.o.   MRN: [id number]  HPI  Gloria Neal is a 59 year old female who presents today for follow up of hypertension. During last visit her BP was above goal initially at 152/92 and improved to 138/84 upon recheck. She endorsed recent family stress at the time of last visit. She was to send over BP readings from home but was unable to do so.  BP Readings from Last 3 Encounters:  07/07/15 138/84  05/07/15 126/82  04/16/15 142/86   She presents with some home BP readings today which have been: 115's-130's/80's on average. Some elevated readings but with return to normal after recheck several hours later. Denies chest pain, dizziness, headaches.   Review of Systems  Respiratory: Negative for shortness of breath.   Cardiovascular: Negative for chest pain.  Neurological: Negative for dizziness, numbness and headaches.       Past Medical History  Diagnosis Date  . Heart murmur     no longer when she has younger  . Hypertension   . Rheumatic fever     59 years old  . Thyroid disease   . Tobacco abuse   . Vertigo   . Mitral valve prolapse   . Cervical dysplasia     Social History   Social History  . Marital Status: Married    Spouse Name: N/A  . Number of Children: N/A  . Years of Education: N/A   Occupational History  . Accts Payable    Social History Main Topics  . Smoking status: Former Smoker    Types: Cigars    Quit date: 04/20/2015  . Smokeless tobacco: Not on file     Comment: 2 cigars a day  . Alcohol Use: 0.0 oz/week    0 Standard drinks or equivalent per week     Comment: occassional  . Drug Use: No  . Sexual Activity: Not on file   Other Topics Concern  . Not on file   Social History Narrative   Married.   No children.   Moved from [place] last August for job.   Vacations in [place] every year.   Enjoys reading in her free time.    Past Surgical History  Procedure Laterality Date    . Tonsillectomy  1972  . Cystectomy      right brow and buttocks    Family History  Problem Relation Age of Onset  . Cancer Father     Lung CA  . Arthritis Maternal Grandmother   . Hypothyroidism Sister   . Glaucoma Mother   . Hypothyroidism Mother     No Known Allergies  Current Outpatient Prescriptions on File Prior to Visit  Medication Sig Dispense Refill  . buPROPion (WELLBUTRIN SR) 150 MG 12 hr tablet Take 1 tablet (150 mg total) by mouth 2 (two) times daily. 60 tablet 3  . triamcinolone cream (KENALOG) 0.1 % Apply 1 application topically 2 (two) times daily. 30 g 0   No current facility-administered medications on file prior to visit.    BP 132/80 mmHg  Pulse 65  Temp(Src) 98 F (36.7 C) (Oral)  Ht 5\' 7"  (1.702 m)  Wt 171 lb 1.9 oz (77.62 kg)  BMI 26.80 kg/m2  SpO2 95%    Objective:   Physical Exam  Constitutional: She appears well-nourished.  Cardiovascular: Normal rate and regular rhythm.   Pulmonary/Chest: Effort normal and breath sounds normal.  Skin: Skin  is warm and dry.          Assessment & Plan:

## 2015-08-19 NOTE — Patient Instructions (Addendum)
Schedule a lab only appointment in November for recheck of your cholesterol.  Continue to check your blood pressure 2 times weekly. Please email me if your readings remain above 140/90 for several weeks.  Follow up in 6 months.  It was a pleasure to see you today!

## 2015-08-19 NOTE — Progress Notes (Signed)
Patient Name: Gloria Neal, Gloria Neal Date: 07/26/2015 Gender: Female D.O.B: 02/08/1956 Age (years): 18 Referring Provider: Chesley Mires MD, ABSM Height (inches): 58 Interpreting Physician: Chesley Mires MD, ABSM Weight (lbs): 160 RPSGT: Baxter Flattery BMI: 25 MRN: [id number] Neck Size: 14.00  CLINICAL INFORMATION Sleep Study Type: NPSG  Indication for sleep study: Fatigue, Hypertension, Snoring  Epworth Sleepiness Score: 6  SLEEP STUDY TECHNIQUE As per the AASM Manual for the Scoring of Sleep and Associated Events v2.3 (April 2016) with a hypopnea requiring 4% desaturations.  The channels recorded and monitored were frontal, central and occipital EEG, electrooculogram (EOG), submentalis EMG (chin), nasal and oral airflow, thoracic and abdominal wall motion, anterior tibialis EMG, snore microphone, electrocardiogram, and pulse oximetry.  MEDICATIONS Patient's medications include: reviewed in the electronic medical record. Medications self-administered by patient during sleep study : No sleep medicine administered.  SLEEP ARCHITECTURE The study was initiated at 11:05:19 PM and ended at 5:20:22 AM.  Sleep onset time was 9.5 minutes and the sleep efficiency was 86.5%. The total sleep time was 324.6 minutes.  Stage REM latency was 120.5 minutes.  The patient spent 5.55% of the night in stage N1 sleep, 77.97% in stage N2 sleep, 0.00% in stage N3 and 16.48% in REM.  Alpha intrusion was absent.  Supine sleep was 0.00%.  RESPIRATORY PARAMETERS The overall apnea/hypopnea index (AHI) was 3.5 per hour. There were 9 total apneas, including 9 obstructive, 0 central and 0 mixed apneas. There were 10 hypopneas and 1 RERAs.  The AHI during Stage REM sleep was 20.2 per hour.  AHI while supine was N/A per hour.  The mean oxygen saturation was 94.28%. The minimum SpO2 during sleep was 89.00%.  Loud snoring was noted during this study.  CARDIAC DATA The 2 lead EKG demonstrated sinus  rhythm. The mean heart rate was 64.00 beats per minute. Other EKG findings include: none.  LEG MOVEMENT DATA The total PLMS were 53 with a resulting PLMS index of 9.80. Associated arousal with leg movement index was 0.7 .  IMPRESSIONS This study showed REM related obstructive sleep apnea with a REM AHI of 20.2.  The overall AHI was 3.5 with an SaO2 low of 89%.  DIAGNOSIS REM related Obstructive Sleep Apnea.  RECOMMENDATIONS Avoid alcohol, sedatives and other CNS depressants that may worsen sleep apnea and disrupt normal sleep architecture. Sleep hygiene should be reviewed to assess factors that may improve sleep quality. Weight management and regular exercise should be initiated or continued if appropriate.   Chesley Mires, MD, [place], American Board of Sleep Medicine 08/19/2015, 8:58 AM  NPI: [id number]

## 2015-08-19 NOTE — Assessment & Plan Note (Signed)
Stable today. Home readings stable. Continue to remain off of lisinopril. Work on diet and exercise. She will check BP 2 times weekly and notify me if readings are above 140/90 consistently. Follow up in 6 months.

## 2015-08-21 NOTE — Telephone Encounter (Signed)
Called and spoke with pt. Reviewed results and recs. Scheduled ov with TP on 09/07/15 Pt voiced understanding and had no further questions.  Nothing further needed

## 2015-09-07 ENCOUNTER — Ambulatory Visit (INDEPENDENT_AMBULATORY_CARE_PROVIDER_SITE_OTHER): Payer: Managed Care, Other (non HMO) | Admitting: Adult Health

## 2015-09-07 ENCOUNTER — Encounter: Payer: Self-pay | Admitting: Adult Health

## 2015-09-07 VITALS — BP 124/86 | HR 96 | Temp 98.4°F | Ht 67.0 in | Wt 174.2 lb

## 2015-09-07 DIAGNOSIS — G479 Sleep disorder, unspecified: Secondary | ICD-10-CM | POA: Diagnosis not present

## 2015-09-07 DIAGNOSIS — Z23 Encounter for immunization: Secondary | ICD-10-CM

## 2015-09-07 NOTE — Assessment & Plan Note (Addendum)
HST showed AHI at 3.5 w/ SaO2 at 89%, REM AHI at 20 We discussed healthy sleep and regular exercise  Follow up with our office as directed. And As needed

## 2015-09-07 NOTE — Patient Instructions (Signed)
Flu shot .  Healthy sleep regimen as discussed Follow up with Dr. Halford Chessman  As needed

## 2015-09-07 NOTE — Progress Notes (Signed)
Reviewed and agree with assessment/plan. 

## 2015-09-07 NOTE — Progress Notes (Signed)
   Subjective:    Patient ID: Gloria Neal, female    DOB: 1956-02-02, 59 y.o.   MRN: [id number]  HPI 59 yo female seen for sleep consult in August 2016   09/07/2015 Follow up : Sleep disorder  Pt returns for follow up to review test .  Pt was seen in August for sleep consult with restless sleep.  She was set up for a home sleep study on 07/26/15 that showed  AHI at 3.5 with SaO2 low at 89%. Did show REM AHI at 20.2.  She is on Wellbutrin but denies  Sedating medication use.  Uses wellbutrin for smoking cessation .  We discussed her results and healthy sleep regimen.  Along with regular exercise.  She has some daytime fatigue but no significant daytime sleepiness.  Denies chest pain, orthopnea, edema or fever.    Review of Systems Constitutional:   No  weight loss, night sweats,  Fevers, chills, + fatigue, or  lassitude.  HEENT:   No headaches,  Difficulty swallowing,  Tooth/dental problems, or  Sore throat,                No sneezing, itching, ear ache, nasal congestion, post nasal drip,   CV:  No chest pain,  Orthopnea, PND, swelling in lower extremities, anasarca, dizziness, palpitations, syncope.   GI  No heartburn, indigestion, abdominal pain, nausea, vomiting, diarrhea, change in bowel habits, loss of appetite, bloody stools.   Resp: No shortness of breath with exertion or at rest.  No excess mucus, no productive cough,  No non-productive cough,  No coughing up of blood.  No change in color of mucus.  No wheezing.  No chest wall deformity  Skin: no rash or lesions.  GU: no dysuria, change in color of urine, no urgency or frequency.  No flank pain, no hematuria   MS:  No joint pain or swelling.  No decreased range of motion.  No back pain.  Psych:  No change in mood or affect. No depression or anxiety.  No memory loss.         Objective:   Physical Exam GEN: A/Ox3; pleasant , NAD, well nourished   HEENT:  [place]/AT,  EACs-clear, TMs-wnl, NOSE-clear, THROAT-clear, no  lesions, no postnasal drip or exudate noted. , class 1 airway   NECK:  Supple w/ fair ROM; no JVD; normal carotid impulses w/o bruits; no thyromegaly or nodules palpated; no lymphadenopathy.  RESP  Clear  P & A; w/o, wheezes/ rales/ or rhonchi.no accessory muscle use, no dullness to percussion  CARD:  RRR, no m/r/g  , no peripheral edema, pulses intact, no cyanosis or clubbing.  GI:   Soft & nt; nml bowel sounds; no organomegaly or masses detected.  Musco: Warm bil, no deformities or joint swelling noted.   Neuro: alert, no focal deficits noted.    Skin: Warm, no lesions or rashes         Assessment & Plan:

## 2015-10-19 ENCOUNTER — Telehealth: Payer: Self-pay | Admitting: Primary Care

## 2015-10-19 ENCOUNTER — Other Ambulatory Visit (INDEPENDENT_AMBULATORY_CARE_PROVIDER_SITE_OTHER): Payer: Managed Care, Other (non HMO)

## 2015-10-19 DIAGNOSIS — E785 Hyperlipidemia, unspecified: Secondary | ICD-10-CM | POA: Diagnosis not present

## 2015-10-19 LAB — LIPID PANEL
CHOLESTEROL: 210 mg/dL — AB (ref 0–200)
HDL: 43.8 mg/dL (ref >39.00)
LDL CALC: 136 mg/dL — AB (ref 0–99)
NonHDL: 166.4
Total CHOL/HDL Ratio: 5
Triglycerides: 152 mg/dL — ABNORMAL HIGH (ref 0.0–149.0)
VLDL: 30.4 mg/dL (ref 0.0–40.0)

## 2015-10-19 NOTE — Telephone Encounter (Signed)
Patient returned Gina's call.

## 2015-10-20 NOTE — Telephone Encounter (Signed)
See result note.  

## 2015-12-02 ENCOUNTER — Other Ambulatory Visit: Payer: Self-pay | Admitting: Primary Care

## 2015-12-02 ENCOUNTER — Encounter: Payer: Self-pay | Admitting: Primary Care

## 2015-12-02 DIAGNOSIS — Z720 Tobacco use: Secondary | ICD-10-CM

## 2015-12-02 MED ORDER — BUPROPION HCL ER (SR) 150 MG PO TB12: 150.0000 mg | ORAL_TABLET | Freq: Two times a day (BID) | ORAL | Status: DC

## 2015-12-03 ENCOUNTER — Other Ambulatory Visit: Payer: Self-pay | Admitting: Primary Care

## 2015-12-03 DIAGNOSIS — R21 Rash and other nonspecific skin eruption: Secondary | ICD-10-CM

## 2015-12-03 MED ORDER — FLUOCINONIDE-E 0.05 % EX CREA: 1.0000 "application " | TOPICAL_CREAM | Freq: Two times a day (BID) | CUTANEOUS | Status: DC

## 2016-01-20 ENCOUNTER — Encounter: Payer: Self-pay | Admitting: Primary Care

## 2016-02-16 ENCOUNTER — Ambulatory Visit (INDEPENDENT_AMBULATORY_CARE_PROVIDER_SITE_OTHER): Payer: Managed Care, Other (non HMO) | Admitting: Primary Care

## 2016-02-16 ENCOUNTER — Encounter: Payer: Self-pay | Admitting: Primary Care

## 2016-02-16 VITALS — BP 120/78 | HR 88 | Temp 97.4°F | Ht 67.0 in | Wt 170.0 lb

## 2016-02-16 DIAGNOSIS — Z0184 Encounter for antibody response examination: Secondary | ICD-10-CM

## 2016-02-16 DIAGNOSIS — E785 Hyperlipidemia, unspecified: Secondary | ICD-10-CM

## 2016-02-16 DIAGNOSIS — R21 Rash and other nonspecific skin eruption: Secondary | ICD-10-CM | POA: Diagnosis not present

## 2016-02-16 DIAGNOSIS — M779 Enthesopathy, unspecified: Secondary | ICD-10-CM

## 2016-02-16 DIAGNOSIS — Z720 Tobacco use: Secondary | ICD-10-CM

## 2016-02-16 DIAGNOSIS — I10 Essential (primary) hypertension: Secondary | ICD-10-CM

## 2016-02-16 LAB — LIPID PANEL
CHOL/HDL RATIO: 5
Cholesterol: 211 mg/dL — ABNORMAL HIGH (ref 0–200)
HDL: 46.5 mg/dL (ref >39.00)
NonHDL: 164.92
Triglycerides: 256 mg/dL — ABNORMAL HIGH (ref 0.0–149.0)
VLDL: 51.2 mg/dL — AB (ref 0.0–40.0)

## 2016-02-16 LAB — LDL CHOLESTEROL, DIRECT: LDL DIRECT: 129 mg/dL

## 2016-02-16 MED ORDER — NAPROXEN 500 MG PO TABS: 500.0000 mg | ORAL_TABLET | Freq: Two times a day (BID) | ORAL | Status: DC | PRN

## 2016-02-16 NOTE — Assessment & Plan Note (Signed)
Continues to be stable off lisinopril.

## 2016-02-16 NOTE — Assessment & Plan Note (Signed)
Continues to refrain from smoking since May 16th 2016. Provided encouragement and commended her on this accomplishment. Continue Wellbutrin.

## 2016-02-16 NOTE — Progress Notes (Signed)
Pre visit review using our clinic review tool, if applicable. No additional management support is needed unless otherwise documented below in the visit note. 

## 2016-02-16 NOTE — Assessment & Plan Note (Signed)
Ongoing for nearly 1 year, no improvement with multiple RX creams. Sent to dermatology for further evaluation.

## 2016-02-16 NOTE — Patient Instructions (Addendum)
You will be contacted regarding your referral to Dermatology.  Please let us know if you have not heard back within one week.   Complete lab work prior to leaving today. I will notify you of your results once received.   Wear your brace as work and at bedtime as discussed. You may take Naproxen twice daily with food as needed for pain. Rest your arm if possible.   No smoking in [place]! Congratulations on your success!  Follow up for your physical anytime after May 2017.  It was a pleasure to see you today!

## 2016-02-16 NOTE — Progress Notes (Signed)
Subjective:    Patient ID: Gloria Neal, female    DOB: 1956/04/01, 60 y.o.   MRN: [id number]  HPI   Ms. Gloria Neal is a 60 year old female who presents today for follow up.  1) Essential Hypertension: Once managed on lisinopril, but has been off for 6+ months as her BP has been stable without. She's been working on improving her diet to keep blood pressure levels at goal. She was instructed to notify us if home readings were consistently above 140/90. BP stable in clinic today. Denies chest pain, dizziness, headaches.   2) Hyperlipidemia: TC of 210, Trigs of 152, and LDL 136 in November 2016. She was challenged to work on a low fat diet as she did not wish to start statin therapy. She's started exercising regularily in January 2017 and has reduced her consumption of fast foods.  Her diet currently consists of: Breakfast: Oatmeal, grape juice Lunch: Frozen meals, salad Dinner: Meat, vegetables, starch Snacks: Granola bars  Desserts: Sugar free popcycles, prior to was eating ice cream Beverages: coffee, water   3) Tobacco Abuse: Stopped smoking on May 16th 2016. Currently managed on Wellbutrin SR 150 mg. She is taking this medication once daily. Continues to refrain from smoking.   4) Arm Pain: Present to the right lateral and anterior forearm for the past 2-3 months. She describes her pain as constant and achy. Pain is worse with straightening her arm, gripping objects, turning her forearm. She's taken aspirin or ibuprofen sparingly as needed. She uses her right arm repetitively all day and will then her home tablet/ipad with the same arm. Denies numbness/tingling, recent injury or trauma. She's taken ibuprofen and wearing a brace irregularly with some improvement.   5) Rash: Located to bilateral lower extremities with small areas to bilateral hands. Itchy and painful at times. Prior treatment with triamcinolone cream without improvement, then with Lidex without resolve. Rash now worse as it  is spreading. Denies changes in soap products, detergents, foods, etc.   Review of Systems  Respiratory: Negative for shortness of breath.   Cardiovascular: Negative for chest pain.  Musculoskeletal:       Right forearm pain  Neurological: Negative for dizziness and headaches.       Past Medical History  Diagnosis Date  . Heart murmur     no longer when she has younger  . Hypertension   . Rheumatic fever     60 years old  . Thyroid disease   . Tobacco abuse   . Vertigo   . Mitral valve prolapse   . Cervical dysplasia     Social History   Social History  . Marital Status: Married    Spouse Name: N/A  . Number of Children: N/A  . Years of Education: N/A   Occupational History  . Accts Payable    Social History Main Topics  . Smoking status: Former Smoker    Types: Cigars    Quit date: 04/20/2015  . Smokeless tobacco: Not on file     Comment: 2 cigars a day  . Alcohol Use: 0.0 oz/week    0 Standard drinks or equivalent per week     Comment: occassional  . Drug Use: No  . Sexual Activity: Not on file   Other Topics Concern  . Not on file   Social History Narrative   Married.   No children.   Moved from [place] last August for job.   Vacations in [place] every year.   Enjoys  reading in her free time.    Past Surgical History  Procedure Laterality Date  . Tonsillectomy  1972  . Cystectomy      right brow and buttocks    Family History  Problem Relation Age of Onset  . Cancer Father     Lung CA  . Arthritis Maternal Grandmother   . Hypothyroidism Sister   . Glaucoma Mother   . Hypothyroidism Mother     No Known Allergies  Current Outpatient Prescriptions on File Prior to Visit  Medication Sig Dispense Refill  . buPROPion (WELLBUTRIN SR) 150 MG 12 hr tablet Take 1 tablet (150 mg total) by mouth 2 (two) times daily. 60 tablet 5  . fluocinonide-emollient (LIDEX-E) 0.05 % cream Apply 1 application topically 2 (two) times daily. 30 g 0  . levothyroxine  (SYNTHROID, LEVOTHROID) 25 MCG tablet Take 1 tablet (25 mcg total) by mouth daily before breakfast. 30 tablet 5  . triamcinolone cream (KENALOG) 0.1 % Apply 1 application topically 2 (two) times daily. (Patient not taking: Reported on 02/16/2016) 30 g 0   No current facility-administered medications on file prior to visit.    BP 120/78 mmHg  Pulse 88  Temp(Src) 97.4 F (36.3 C) (Oral)  Ht 5\' 7"  (1.702 m)  Wt 170 lb (77.111 kg)  BMI 26.62 kg/m2  SpO2 95%    Objective:   Physical Exam  Constitutional: She appears well-nourished.  Cardiovascular: Normal rate and regular rhythm.   Pulmonary/Chest: Effort normal and breath sounds normal.  Musculoskeletal:  Tenderness to right anterior forearm. No decrease in ROM.   Skin: Skin is warm and dry. No erythema.  Psychiatric: She has a normal mood and affect.          Assessment & Plan:  Tendonitis:  Pain to right arm, constant and achy. Uses her right forearm repetitively throughout the day and also at home. No obvious deformity or skin irritation upon exam. Suspect tendonitis due to site of pain, repetitive use, and description of symptoms. Good ROM. Will treat with supportive measures. Rest, Naproxen, brace. Return precautions provided.

## 2016-02-16 NOTE — Assessment & Plan Note (Signed)
TC above goal and unchanged from prior labs. Trigs nearly doubled. Discussed to start Fish Oil daily. She has been working to exercise and improve her diet just recently. Will continue to monitor and initiate statin therapy if levels progress in 3 months. Repeat lipids in 3 months.

## 2016-02-17 LAB — VARICELLA ZOSTER ANTIBODY, IGG: Varicella IgG: >4000 Index — ABNORMAL HIGH (ref <135.00)

## 2016-02-26 ENCOUNTER — Other Ambulatory Visit: Payer: Self-pay | Admitting: Primary Care

## 2016-04-22 ENCOUNTER — Encounter: Payer: Self-pay | Admitting: Primary Care

## 2016-04-22 ENCOUNTER — Ambulatory Visit (INDEPENDENT_AMBULATORY_CARE_PROVIDER_SITE_OTHER): Payer: Managed Care, Other (non HMO) | Admitting: Primary Care

## 2016-04-22 ENCOUNTER — Other Ambulatory Visit (HOSPITAL_COMMUNITY)
Admission: RE | Admit: 2016-04-22 | Discharge: 2016-04-22 | Disposition: A | Discharge status: Home / Self Care | Payer: Managed Care, Other (non HMO) | Source: Ambulatory Visit | Attending: Primary Care | Admitting: Primary Care

## 2016-04-22 VITALS — BP 120/78 | HR 82 | Temp 98.1°F | Ht 67.0 in | Wt 166.8 lb

## 2016-04-22 DIAGNOSIS — Z1151 Encounter for screening for human papillomavirus (HPV): Secondary | ICD-10-CM | POA: Diagnosis present

## 2016-04-22 DIAGNOSIS — E785 Hyperlipidemia, unspecified: Secondary | ICD-10-CM | POA: Diagnosis not present

## 2016-04-22 DIAGNOSIS — Z0000 Encounter for general adult medical examination without abnormal findings: Secondary | ICD-10-CM

## 2016-04-22 DIAGNOSIS — Z23 Encounter for immunization: Secondary | ICD-10-CM

## 2016-04-22 DIAGNOSIS — Z01419 Encounter for gynecological examination (general) (routine) without abnormal findings: Secondary | ICD-10-CM | POA: Diagnosis present

## 2016-04-22 DIAGNOSIS — R21 Rash and other nonspecific skin eruption: Secondary | ICD-10-CM

## 2016-04-22 DIAGNOSIS — Z124 Encounter for screening for malignant neoplasm of cervix: Secondary | ICD-10-CM

## 2016-04-22 DIAGNOSIS — E039 Hypothyroidism, unspecified: Secondary | ICD-10-CM

## 2016-04-22 DIAGNOSIS — I10 Essential (primary) hypertension: Secondary | ICD-10-CM | POA: Diagnosis not present

## 2016-04-22 DIAGNOSIS — Z720 Tobacco use: Secondary | ICD-10-CM

## 2016-04-22 LAB — HEMOGLOBIN A1C: Hgb A1c MFr Bld: 5.9 % (ref 4.6–6.5)

## 2016-04-22 LAB — VITAMIN D 25 HYDROXY (VIT D DEFICIENCY, FRACTURES): VITD: 27.99 ng/mL — AB (ref 30.00–100.00)

## 2016-04-22 LAB — LIPID PANEL
CHOL/HDL RATIO: 4
Cholesterol: 187 mg/dL (ref 0–200)
HDL: 42.5 mg/dL (ref >39.00)
LDL Cholesterol: 120 mg/dL — ABNORMAL HIGH (ref 0–99)
NONHDL: 144.66
TRIGLYCERIDES: 123 mg/dL (ref 0.0–149.0)
VLDL: 24.6 mg/dL (ref 0.0–40.0)

## 2016-04-22 LAB — COMPREHENSIVE METABOLIC PANEL
ALT: 17 U/L (ref 0–35)
AST: 17 U/L (ref 0–37)
Albumin: 4.3 g/dL (ref 3.5–5.2)
Alkaline Phosphatase: 64 U/L (ref 39–117)
BUN: 10 mg/dL (ref 6–23)
CHLORIDE: 107 meq/L (ref 96–112)
CO2: 27 meq/L (ref 19–32)
CREATININE: 0.73 mg/dL (ref 0.40–1.20)
Calcium: 9.5 mg/dL (ref 8.4–10.5)
GFR: 86.38 mL/min (ref >60.00)
Glucose, Bld: 86 mg/dL (ref 70–99)
POTASSIUM: 3.9 meq/L (ref 3.5–5.1)
SODIUM: 141 meq/L (ref 135–145)
Total Bilirubin: 0.4 mg/dL (ref 0.2–1.2)
Total Protein: 6.7 g/dL (ref 6.0–8.3)

## 2016-04-22 MED ORDER — ZOSTER VACCINE LIVE 19400 UNT/0.65ML ~~LOC~~ SUSR: 0.6500 mL | Freq: Once | SUBCUTANEOUS | Status: DC

## 2016-04-22 MED ORDER — BUPROPION HCL ER (SR) 150 MG PO TB12: 150.0000 mg | ORAL_TABLET | Freq: Two times a day (BID) | ORAL | Status: DC

## 2016-04-22 NOTE — Patient Instructions (Signed)
Complete lab work prior to leaving today. I will notify you of your results once received.   Take the shingles vaccination to your pharmacy to see if your insurance will cover. If not then e-mail me so we can administer it in our office.  I will notify you once we receive your Pap results.  Schedule your mammogram.  Continue to work on your diet and exercise.  I've sent refills of your bupropion to the pharmacy.   Follow up in 1 year for repeat physical or sooner if needed.  It was a pleasure to see you today!

## 2016-04-22 NOTE — Progress Notes (Signed)
Subjective:    Patient ID: Gloria Neal, female    DOB: July 31, 1956, 60 y.o.   MRN: [id number]  HPI  Gloria Neal is a 60 year old female who presents today for complete physical.  Immunizations: -Tetanus: Completed in 2008. -Influenza: Completed in October 2016. -Pneumonia: Completed in May 2016. -Shingles: Never received. Due.  Diet: She endorses a fair diet. Breakfast: Oatmeal with nuts.  Lunch: Salad Dinner: Meat, potatoes, vegetables Snacks: Fruit, sugar free fudgecycles.  Desserts: Sugar free several times nightly.  Beverages: Water, juice, coffee  Exercise: She is working out on her elliptical most days of the weeks.  Eye exam: Completed in 2016.  Dental exam: Has not completed in years.  Colonoscopy: Completed in 2013, due in 2023. Pap Smear: Completed 3 years ago. Mammogram: Completed in 2016, normal.    Review of Systems  Constitutional: Negative for unexpected weight change.  HENT: Negative for rhinorrhea.   Respiratory: Negative for cough and shortness of breath.   Cardiovascular: Negative for chest pain.  Gastrointestinal: Negative for diarrhea and constipation.  Genitourinary: Negative for difficulty urinating and menstrual problem.  Musculoskeletal: Negative for myalgias and arthralgias.  Skin: Negative for rash.  Allergic/Immunologic: Negative for environmental allergies.  Neurological: Negative for dizziness, numbness and headaches.  Psychiatric/Behavioral:       Denies concerns for anxiety and depression       Past Medical History  Diagnosis Date  . Heart murmur     no longer when she has younger  . Hypertension   . Rheumatic fever     60 years old  . Thyroid disease   . Tobacco abuse   . Vertigo   . Mitral valve prolapse   . Cervical dysplasia      Social History   Social History  . Marital Status: Married    Spouse Name: N/A  . Number of Children: N/A  . Years of Education: N/A   Occupational History  . Accts Payable    Social  History Main Topics  . Smoking status: Former Smoker    Types: Cigars    Quit date: 04/20/2015  . Smokeless tobacco: Not on file     Comment: 2 cigars a day  . Alcohol Use: 0.0 oz/week    0 Standard drinks or equivalent per week     Comment: occassional  . Drug Use: No  . Sexual Activity: Not on file   Other Topics Concern  . Not on file   Social History Narrative   Married.   No children.   Moved from [place] last August for job.   Vacations in [place] every year.   Enjoys reading in her free time.    Past Surgical History  Procedure Laterality Date  . Tonsillectomy  1972  . Cystectomy      right brow and buttocks    Family History  Problem Relation Age of Onset  . Cancer Father     Lung CA  . Arthritis Maternal Grandmother   . Hypothyroidism Sister   . Glaucoma Mother   . Hypothyroidism Mother     No Known Allergies  Current Outpatient Prescriptions on File Prior to Visit  Medication Sig Dispense Refill  . levothyroxine (SYNTHROID, LEVOTHROID) 25 MCG tablet TAKE 1 TABLET(25 MCG) BY MOUTH DAILY BEFORE BREAKFAST 30 tablet 5  . naproxen (NAPROSYN) 500 MG tablet Take 1 tablet (500 mg total) by mouth 2 (two) times daily as needed for moderate pain. 60 tablet 1   No current facility-administered  medications on file prior to visit.    BP 120/78 mmHg  Pulse 82  Temp(Src) 98.1 F (36.7 C) (Oral)  Ht 5\' 7"  (1.702 m)  Wt 166 lb 12.8 oz (75.66 kg)  BMI 26.12 kg/m2  SpO2 98%    Objective:   Physical Exam  Constitutional: She is oriented to person, place, and time. She appears well-nourished.  HENT:  Right Ear: Tympanic membrane and ear canal normal.  Left Ear: Tympanic membrane and ear canal normal.  Nose: Nose normal.  Mouth/Throat: Oropharynx is clear and moist.  Eyes: Conjunctivae and EOM are normal. Pupils are equal, round, and reactive to light.  Neck: Neck supple. No thyromegaly present.  Cardiovascular: Normal rate and regular rhythm.   No murmur  heard. Pulmonary/Chest: Effort normal and breath sounds normal. She has no rales.  Abdominal: Soft. Bowel sounds are normal. There is no tenderness.  Genitourinary: Cervix exhibits no motion tenderness. Right adnexum displays no mass. Left adnexum displays no mass. No vaginal discharge found.  Minimal whitish discharge.   Musculoskeletal: Normal range of motion.  Lymphadenopathy:    She has no cervical adenopathy.  Neurological: She is alert and oriented to person, place, and time. She has normal reflexes. No cranial nerve deficit.  Skin: Skin is warm and dry. No rash noted.  Psychiatric: She has a normal mood and affect.          Assessment & Plan:

## 2016-04-22 NOTE — Assessment & Plan Note (Signed)
Recheck lipids today. She is working on diet and exercise.

## 2016-04-22 NOTE — Assessment & Plan Note (Signed)
Td and pneumonia UTD. Rx printed for Zostavax, she will check insurance for coverage. Pap due and completed today. She is to schedule mammogram. Colonoscopy UTD. Exam unremarkable. Labs pending. Continue efforts towards healthy lifestyle through diet and exercise.  Follow up in 1 year for repeat physical.

## 2016-04-22 NOTE — Assessment & Plan Note (Signed)
TSH stable in March 2017. Continue current regimen.

## 2016-04-22 NOTE — Assessment & Plan Note (Signed)
Stable off lisinopril. Will continue to monitor.

## 2016-04-22 NOTE — Addendum Note (Signed)
Addended by: Jacqualin Combes on: 04/22/2016 01:51 PM   Modules accepted: Orders, SmartSet

## 2016-04-22 NOTE — Progress Notes (Signed)
Pre visit review using our clinic review tool, if applicable. No additional management support is needed unless otherwise documented below in the visit note. 

## 2016-04-22 NOTE — Assessment & Plan Note (Signed)
Improved with triamcinolone cream.

## 2016-04-22 NOTE — Assessment & Plan Note (Signed)
Did smoke a little while on vacation in April, has not smoked since. Encouraged her to refrain from smoking at all times. Refill of bupropion provided.

## 2016-04-26 LAB — CYTOLOGY - PAP

## 2016-05-04 ENCOUNTER — Telehealth: Payer: Self-pay | Admitting: Primary Care

## 2016-05-04 NOTE — Telephone Encounter (Signed)
-----   Message from Pleas [name], NP sent at 04/22/2016 10:48 AM EDT ----- Regarding: Shingles and Mammogram Has Ms. Keena inquired about her shingles vaccination? Has she scheduled her mammogram?

## 2016-05-05 NOTE — Telephone Encounter (Signed)
Message left for patient to return my call.  Also received letter from [facility] that patient received Zostavax on 04/23/2016 and already updated in Health Maintenance.

## 2016-05-05 NOTE — Telephone Encounter (Signed)
Noted  

## 2016-05-13 ENCOUNTER — Telehealth: Payer: Self-pay | Admitting: Primary Care

## 2016-05-13 NOTE — Telephone Encounter (Signed)
I could not reach pt by phone and spoke with pts husband and pt is at [facility] now for lump under ear; pt will cb to schedule f/u with [name] NP. Juluis Rainier to [name] NP.

## 2016-05-13 NOTE — Telephone Encounter (Signed)
PLEASE NOTE: All timestamps contained within this report are represented as [place] Standard Time. CONFIDENTIALTY NOTICE: This fax transmission is intended only for the addressee. It contains information that is legally privileged, confidential or otherwise protected from use or disclosure. If you are not the intended recipient, you are strictly prohibited from reviewing, disclosing, copying using or disseminating any of this information or taking any action in reliance on or regarding this information. If you have received this fax in error, please notify us immediately by telephone so that we can arrange for its return to [place]. Phone: [phone], Toll-Free: [phone], Fax: [phone] Page: 1 of 1 Call Id: [id number] [place] Patient Name: Gloria Neal DOB: [birth year] Initial Comment caller states she has a lump under her right ear Nurse Assessment Nurse: Dimas Chyle, RN, Dellis Filbert Date/Time Eilene Ghazi Time): 05/13/2016 9:58:03 AM Confirm and document reason for call. If symptomatic, describe symptoms. You must click the next button to save text entered. ---Caller states she has a lump under her right ear. Started yesterday. Has the patient traveled out of the country within the last 30 days? ---No Does the patient have any new or worsening symptoms? ---Yes Will a triage be completed? ---Yes Related visit to physician within the last 2 weeks? ---No Does the PT have any chronic conditions? (i.e. diabetes, asthma, etc.) ---Yes List chronic conditions. ---hypothyroid Is this a behavioral health or substance abuse call? ---No Guidelines Guideline Title Affirmed Question Affirmed Notes Lymph Nodes Swollen [1] Single large node AND [2] size > 1 inch (2.5 cm) AND [3] no fever Final Disposition User See Physician within [place], RN, [employer] Referrals REFERRED TO PCP OFFICE GO TO FACILITY  UNDECIDED Disagree/Comply: Comply

## 2016-05-13 NOTE — Telephone Encounter (Signed)
Patient does not need to schedule follow-up appointment unless recommended by urgent care provider. Please do have her email me with an update

## 2016-08-28 ENCOUNTER — Other Ambulatory Visit: Payer: Self-pay | Admitting: Primary Care

## 2016-09-27 ENCOUNTER — Other Ambulatory Visit: Payer: Self-pay | Admitting: Primary Care

## 2016-09-27 DIAGNOSIS — E039 Hypothyroidism, unspecified: Secondary | ICD-10-CM

## 2016-09-27 NOTE — Telephone Encounter (Signed)
Ok to refill? Electronically refill request for   levothyroxine (SYNTHROID, LEVOTHROID) 25 MCG tablet  Last prescribed on 09/02/2016. Last seen on 04/22/2016.

## 2016-09-28 NOTE — Telephone Encounter (Signed)
Message left for patient to return my call.  

## 2016-09-29 NOTE — Telephone Encounter (Signed)
Sent patient a MyChart message to call for a lab appointment for TSH.

## 2016-10-18 ENCOUNTER — Encounter: Payer: Self-pay | Admitting: Primary Care

## 2016-10-18 ENCOUNTER — Ambulatory Visit (INDEPENDENT_AMBULATORY_CARE_PROVIDER_SITE_OTHER): Payer: Managed Care, Other (non HMO) | Admitting: Primary Care

## 2016-10-18 VITALS — BP 120/82 | HR 82 | Temp 98.5°F | Ht 67.0 in | Wt 164.0 lb

## 2016-10-18 DIAGNOSIS — E039 Hypothyroidism, unspecified: Secondary | ICD-10-CM | POA: Diagnosis not present

## 2016-10-18 DIAGNOSIS — Z23 Encounter for immunization: Secondary | ICD-10-CM | POA: Diagnosis not present

## 2016-10-18 DIAGNOSIS — M1991 Primary osteoarthritis, unspecified site: Secondary | ICD-10-CM | POA: Diagnosis not present

## 2016-10-18 DIAGNOSIS — R7303 Prediabetes: Secondary | ICD-10-CM | POA: Diagnosis not present

## 2016-10-18 LAB — TSH: TSH: 1.42 u[IU]/mL (ref 0.35–4.50)

## 2016-10-18 LAB — HEMOGLOBIN A1C: Hgb A1c MFr Bld: 5.7 % (ref 4.6–6.5)

## 2016-10-18 MED ORDER — MELOXICAM 15 MG PO TABS: 15.0000 mg | ORAL_TABLET | Freq: Every day | ORAL | 2 refills | Status: DC

## 2016-10-18 NOTE — Patient Instructions (Signed)
Start meloxicam (Mobic) 15 mg tablets as needed for pain/inflammation to joints. Take 1 tablet by mouth daily with food for 2 weeks.   Stretching and exercises will help to reduce symptoms.  Complete lab work prior to leaving today. I will notify you of your results once received.   It was a pleasure to see you today!    Osteoarthritis Osteoarthritis is a type of arthritis that affects tissue that covers the ends of bones in joints (cartilage). Cartilage acts as a cushion between the bones and helps them move smoothly. Osteoarthritis results when cartilage in the joints gets worn down. Osteoarthritis is sometimes called "wear and tear" arthritis. Osteoarthritis is the most common form of arthritis. It often occurs in older people. It is a condition that gets worse over time (a progressive condition). Joints that are most often affected by this condition are in:  Fingers.  Toes.  Hips.  Knees.  Spine, including neck and lower back. What are the causes? This condition is caused by age-related wearing down of cartilage that covers the ends of bones. What increases the risk? The following factors may make you more likely to develop this condition:  Older age.  Being overweight or obese.  Overuse of joints, such as in athletes.  Past injury of a joint.  Past surgery on a joint.  Family history of osteoarthritis. What are the signs or symptoms? The main symptoms of this condition are pain, swelling, and stiffness in the joint. The joint may lose its shape over time. Small pieces of bone or cartilage may break off and float inside of the joint, which may cause more pain and damage to the joint. Small deposits of bone (osteophytes) may grow on the edges of the joint. Other symptoms may include:  A grating or scraping feeling inside the joint when you move it.  Popping or creaking sounds when you move. Symptoms may affect one or more joints. Osteoarthritis in a major joint, such  as your knee or hip, can make it painful to walk or exercise. If you have osteoarthritis in your hands, you might not be able to grip items, twist your hand, or control small movements of your hands and fingers (fine motor skills). How is this diagnosed? This condition may be diagnosed based on:  Your medical history.  A physical exam.  Your symptoms.  X-rays of the affected joint(s).  Blood tests to rule out other types of arthritis. How is this treated? There is no cure for this condition, but treatment can help to control pain and improve joint function. Treatment plans may include:  A prescribed exercise program that allows for rest and joint relief. You may work with a physical therapist.  A weight control plan.  Pain relief techniques, such as:  Applying heat and cold to the joint.  Electric pulses delivered to nerve endings under the skin (transcutaneous electrical nerve stimulation, or TENS).  Massage.  Certain nutritional supplements.  NSAIDs or prescription medicines to help relieve pain.  Medicine to help relieve pain and inflammation (corticosteroids). This can be given by mouth (orally) or as an injection.  Assistive devices, such as a brace, wrap, splint, specialized glove, or cane.  Surgery, such as:  An osteotomy. This is done to reposition the bones and relieve pain or to remove loose pieces of bone and cartilage.  Joint replacement surgery. You may need this surgery if you have very bad (advanced) osteoarthritis. Follow these instructions at home: Activity  Rest your affected  joints as directed by your health care provider.  Do not drive or use heavy machinery while taking prescription pain medicine.  Exercise as directed. Your health care provider or physical therapist may recommend specific types of exercise, such as:  Strengthening exercises. These are done to strengthen the muscles that support joints that are affected by arthritis. They can be  performed with weights or with exercise bands to add resistance.  Aerobic activities. These are exercises, such as brisk walking or water aerobics, that get your heart pumping.  Range-of-motion activities. These keep your joints easy to move.  Balance and agility exercises. Managing pain, stiffness, and swelling  If directed, apply heat to the affected area as often as told by your health care provider. Use the heat source that your health care provider recommends, such as a moist heat pack or a heating pad.  If you have a removable assistive device, remove it as told by your health care provider.  Place a towel between your skin and the heat source. If your health care provider tells you to keep the assistive device on while you apply heat, place a towel between the assistive device and the heat source.  Leave the heat on for 20-30 minutes.  Remove the heat if your skin turns bright red. This is especially important if you are unable to feel pain, heat, or cold. You may have a greater risk of getting burned.  If directed, put ice on the affected joint:  If you have a removable assistive device, remove it as told by your health care provider.  Put ice in a plastic bag.  Place a towel between your skin and the bag. If your health care provider tells you to keep the assistive device on during icing, place a towel between the assistive device and the bag.  Leave the ice on for 20 minutes, 2-3 times a day. General instructions  Take over-the-counter and prescription medicines only as told by your health care provider.  Maintain a healthy weight. Follow instructions from your health care provider for weight control. These may include dietary restrictions.  Do not use any products that contain nicotine or tobacco, such as cigarettes and e-cigarettes. These can delay bone healing. If you need help quitting, ask your health care provider.  Use assistive devices as directed by your health  care provider.  Keep all follow-up visits as told by your health care provider. This is important. Where to find more information:  [employer] of Arthritis and Musculoskeletal and Skin Diseases: www.niams.SouthExposed.es  [employer] on Aging: http://kim-miller.com/  American College of Rheumatology: www.rheumatology.org Contact a health care provider if:  Your skin turns red.  You develop a rash.  You have pain that gets worse.  You have a fever along with joint or muscle aches. Get help right away if:  You lose a lot of weight.  You suddenly lose your appetite.  You have night sweats. Summary  Osteoarthritis is a type of arthritis that affects tissue covering the ends of bones in joints (cartilage).  This condition is caused by age-related wearing down of cartilage that covers the ends of bones.  The main symptom of this condition is pain, swelling, and stiffness in the joint.  There is no cure for this condition, but treatment can help to control pain and improve joint function. This information is not intended to replace advice given to you by your health care provider. Make sure you discuss any questions you have with your  health care provider. Document Released: 11/21/2005 Document Revised: 07/25/2016 Document Reviewed: 07/25/2016 Elsevier Interactive Patient Education  2017 [employer].

## 2016-10-18 NOTE — Progress Notes (Signed)
Pre visit review using our clinic review tool, if applicable. No additional management support is needed unless otherwise documented below in the visit note. 

## 2016-10-18 NOTE — Progress Notes (Signed)
Subjective:    Patient ID: Gloria Neal, female    DOB: March 12, 1956, 60 y.o.   MRN: [id number]  HPI  Gloria Neal is a 60 year old female who presents today with a chief complaint of arm pain. Her pain is all over to her right arm for which she describes as achy. She does have some pain to the right shoulder, elbow, and wrist. Her pain is present at rest and also with movement. She takes aspirin and ibuprofen with temporary improvement. She denies inflammation/swelling, recent injury, pain to her left upper extremity, numbness/tinlging, neck pain, back pain. Her symptoms have been getting worse. 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  Review of Systems  Musculoskeletal: Positive for arthralgias. Negative for back pain, joint swelling and neck pain.  Skin: Negative for color change.  Neurological: Negative for numbness.       Past Medical History:  Diagnosis Date  . Cervical dysplasia   . Heart murmur    no longer when she has younger  . Hypertension   . Mitral valve prolapse   . Rheumatic fever    60 years old  . Thyroid disease   . Tobacco abuse   . Vertigo      Social History   Social History  . Marital status: Married    Spouse name: N/A  . Number of children: N/A  . Years of education: N/A   Occupational History  . Accts Payable    Social History Main Topics  . Smoking status: Former Smoker    Types: Cigars    Quit date: 04/20/2015  . Smokeless tobacco: Not on file     Comment: 2 cigars a day  . Alcohol use 0.0 oz/week     Comment: occassional  . Drug use: No  . Sexual activity: Not on file   Other Topics Concern  . Not on file   Social History Narrative   Married.   No children.   Moved from [place] last August for job.   Vacations in [place] every year.   Enjoys reading in her free time.    Past Surgical History:  Procedure Laterality Date  . CYSTECTOMY     right brow and buttocks  .  TONSILLECTOMY  1972    Family History  Problem Relation Age of Onset  . Cancer Father     Lung CA  . Arthritis Maternal Grandmother   . Hypothyroidism Sister   . Glaucoma Mother   . Hypothyroidism Mother     No Known Allergies  Current Outpatient Prescriptions on File Prior to Visit  Medication Sig Dispense Refill  . buPROPion (WELLBUTRIN SR) 150 MG 12 hr tablet Take 1 tablet (150 mg total) by mouth 2 (two) times daily. 60 tablet 5  . levothyroxine (SYNTHROID, LEVOTHROID) 25 MCG tablet TAKE 1 TABLET BY MOUTH EVERY DAY BEFORE BREAKFAST 30 tablet 0  . naproxen (NAPROSYN) 500 MG tablet Take 1 tablet (500 mg total) by mouth 2 (two) times daily as needed for moderate pain. 60 tablet 1   No current facility-administered medications on file prior to visit.     BP 120/82   Pulse 82   Temp 98.5 F (36.9 C) (Oral)   Ht 5\' 7"  (1.702 m)   Wt 164 lb (74.4 kg)   SpO2 98%   BMI 25.69 kg/m    Objective:   Physical Exam  Cardiovascular: Normal rate.  Pulmonary/Chest: Effort normal and breath sounds normal.  Musculoskeletal:       Right shoulder: She exhibits normal range of motion, no tenderness, no bony tenderness and no pain.       Right elbow: She exhibits normal range of motion, no swelling and no deformity. No tenderness found.  Discomfort/achiness to right upper extremity without pinpoint pain. 5/5 strength bilaterally.  Skin: Skin is warm and dry.          Assessment & Plan:

## 2016-10-18 NOTE — Assessment & Plan Note (Signed)
Suspect this to be cause of right upper extremity pain. Discussed trajectory of arthritis and provided options for treatment. Will hold off on xrays for now,will consider if symptoms progress. Will treat with Meloxicam daily PRN. Renal function stable. Discussed regular exercise. May consider PT or injections in the future.

## 2016-10-18 NOTE — Assessment & Plan Note (Signed)
Due for repeat TSH today

## 2016-10-25 ENCOUNTER — Other Ambulatory Visit: Payer: Self-pay | Admitting: Primary Care

## 2016-10-25 DIAGNOSIS — E039 Hypothyroidism, unspecified: Secondary | ICD-10-CM

## 2017-01-16 ENCOUNTER — Other Ambulatory Visit: Payer: Self-pay | Admitting: Primary Care

## 2017-01-16 DIAGNOSIS — M1991 Primary osteoarthritis, unspecified site: Secondary | ICD-10-CM

## 2017-01-16 NOTE — Telephone Encounter (Signed)
Ok to refill? Electronically refill request for meloxicam (MOBIC) 15 MG tablet. Last prescribed and seeno n 10/18/2016.

## 2017-02-13 ENCOUNTER — Other Ambulatory Visit: Payer: Self-pay | Admitting: Primary Care

## 2017-02-13 DIAGNOSIS — M1991 Primary osteoarthritis, unspecified site: Secondary | ICD-10-CM

## 2017-02-13 NOTE — Telephone Encounter (Signed)
Ok to refill? Electronically refill request for meloxicam (MOBIC) 15 MG tablet. Last prescribed on 01/16/2017. Last seen on 10/18/2016

## 2017-04-25 ENCOUNTER — Ambulatory Visit (INDEPENDENT_AMBULATORY_CARE_PROVIDER_SITE_OTHER): Payer: Managed Care, Other (non HMO) | Admitting: Primary Care

## 2017-04-25 ENCOUNTER — Encounter: Payer: Self-pay | Admitting: Primary Care

## 2017-04-25 VITALS — BP 128/78 | HR 101 | Temp 98.0°F | Ht 67.0 in | Wt 163.8 lb

## 2017-04-25 DIAGNOSIS — Z720 Tobacco use: Secondary | ICD-10-CM | POA: Diagnosis not present

## 2017-04-25 DIAGNOSIS — E2839 Other primary ovarian failure: Secondary | ICD-10-CM | POA: Diagnosis not present

## 2017-04-25 DIAGNOSIS — R7303 Prediabetes: Secondary | ICD-10-CM | POA: Diagnosis not present

## 2017-04-25 DIAGNOSIS — Z23 Encounter for immunization: Secondary | ICD-10-CM

## 2017-04-25 DIAGNOSIS — I10 Essential (primary) hypertension: Secondary | ICD-10-CM

## 2017-04-25 DIAGNOSIS — Z1231 Encounter for screening mammogram for malignant neoplasm of breast: Secondary | ICD-10-CM

## 2017-04-25 DIAGNOSIS — M1991 Primary osteoarthritis, unspecified site: Secondary | ICD-10-CM | POA: Diagnosis not present

## 2017-04-25 DIAGNOSIS — Z1239 Encounter for other screening for malignant neoplasm of breast: Secondary | ICD-10-CM

## 2017-04-25 DIAGNOSIS — E039 Hypothyroidism, unspecified: Secondary | ICD-10-CM | POA: Diagnosis not present

## 2017-04-25 DIAGNOSIS — Z0000 Encounter for general adult medical examination without abnormal findings: Secondary | ICD-10-CM | POA: Diagnosis not present

## 2017-04-25 DIAGNOSIS — E785 Hyperlipidemia, unspecified: Secondary | ICD-10-CM | POA: Diagnosis not present

## 2017-04-25 LAB — LIPID PANEL
CHOL/HDL RATIO: 5
Cholesterol: 215 mg/dL — ABNORMAL HIGH (ref 0–200)
HDL: 43.3 mg/dL (ref >39.00)
LDL Cholesterol: 138 mg/dL — ABNORMAL HIGH (ref 0–99)
NONHDL: 171.7
TRIGLYCERIDES: 167 mg/dL — AB (ref 0.0–149.0)
VLDL: 33.4 mg/dL (ref 0.0–40.0)

## 2017-04-25 LAB — COMPREHENSIVE METABOLIC PANEL
ALK PHOS: 72 U/L (ref 39–117)
ALT: 21 U/L (ref 0–35)
AST: 17 U/L (ref 0–37)
Albumin: 4.1 g/dL (ref 3.5–5.2)
BILIRUBIN TOTAL: 0.5 mg/dL (ref 0.2–1.2)
BUN: 11 mg/dL (ref 6–23)
CALCIUM: 9.4 mg/dL (ref 8.4–10.5)
CO2: 29 mEq/L (ref 19–32)
CREATININE: 0.72 mg/dL (ref 0.40–1.20)
Chloride: 108 mEq/L (ref 96–112)
GFR: 87.47 mL/min (ref >60.00)
GLUCOSE: 103 mg/dL — AB (ref 70–99)
Potassium: 3.9 mEq/L (ref 3.5–5.1)
Sodium: 143 mEq/L (ref 135–145)
TOTAL PROTEIN: 6.5 g/dL (ref 6.0–8.3)

## 2017-04-25 LAB — TSH: TSH: 0.67 u[IU]/mL (ref 0.35–4.50)

## 2017-04-25 LAB — HEMOGLOBIN A1C: HEMOGLOBIN A1C: 6 % (ref 4.6–6.5)

## 2017-04-25 MED ORDER — BUPROPION HCL ER (SR) 150 MG PO TB12: 150.0000 mg | ORAL_TABLET | Freq: Every day | ORAL | 3 refills | Status: DC

## 2017-04-25 NOTE — Assessment & Plan Note (Signed)
Has recently started back, smoking intermittently. Discouraged use of tobacco products.

## 2017-04-25 NOTE — Assessment & Plan Note (Signed)
Check TSH today. Compliant to levothyroxine 25 mcg.

## 2017-04-25 NOTE — Assessment & Plan Note (Signed)
Taking Meloxicam daily, discouraged daily use and recommended to take only PRN. She will update if symptoms progress without daily Meloxicam use. Check renal function today.

## 2017-04-25 NOTE — Assessment & Plan Note (Signed)
Td due, provided today, other immunizations UTD. Mammogram and Bone density testing due, pending.  Colonoscopy UTD, due in 2023. Pap UTD. Encouraged her to stop smoking. Exam unremarkable. Labs pending. Follow up in 1 year for annual exam.

## 2017-04-25 NOTE — Addendum Note (Signed)
Addended by: Jacqualin Combes on: 04/25/2017 12:44 PM   Modules accepted: Orders

## 2017-04-25 NOTE — Patient Instructions (Addendum)
You were provided with a tetanus vaccination today which will cover you for 10 years.   Call the [facility] to schedule your mammogram and bone density tests.  Stop smoking!  Continue exercising. You should be getting 150 minutes of moderate intensity exercise weekly.  Complete lab work prior to leaving today. I will notify you of your results once received.   Use the Meloxicam as needed for pain.  Follow up in 1 year for your annual exam or sooner if needed.  It was a pleasure to see you today!

## 2017-04-25 NOTE — Assessment & Plan Note (Signed)
Stable in the office today, continue to monitor.  

## 2017-04-25 NOTE — Assessment & Plan Note (Signed)
Check lipids today. Encouraged increase in exercise.

## 2017-04-25 NOTE — Progress Notes (Signed)
Subjective:    Patient ID: Gloria Neal, female    DOB: January 22, 1956, 61 y.o.   MRN: [id number]  HPI  Gloria Neal is a 61 year old female who presents today for complete physical.  Immunizations: -Tetanus: Completed in 2008, due. -Influenza: Completed last season. -Pneumonia: Completed in 2016. -Shingles: Completed in November 2017  Diet: She endorses a fair diet.  Breakfast: Oatmeal Lunch: Salads Dinner: Meat, vegetable, starch Snacks: None Desserts: Ice cream several times weekly Beverages: Water, occasional soda, coffee  Exercise: She walks daily after work for 30 minutes Eye exam: Completed in 2018 Dental exam: Completes semi-annually Colonoscopy: Completed in 2013, due in 2023 Dexa: Normal in 2016 Pap Smear: Normal in 2017 Mammogram: Normal in 2016    Review of Systems  Constitutional: Negative for unexpected weight change.  HENT: Negative for rhinorrhea.   Respiratory: Negative for cough and shortness of breath.   Cardiovascular: Negative for chest pain.  Gastrointestinal: Negative for constipation and diarrhea.  Genitourinary: Negative for difficulty urinating and menstrual problem.  Musculoskeletal: Negative for arthralgias and myalgias.  Skin: Negative for rash.  Allergic/Immunologic: Negative for environmental allergies.  Neurological: Negative for dizziness, numbness and headaches.  Psychiatric/Behavioral:       Denies concerns for anxiety or depression       Past Medical History:  Diagnosis Date  . Cervical dysplasia   . Heart murmur    no longer when she has younger  . Hypertension   . Mitral valve prolapse   . Rheumatic fever    61 years old  . Thyroid disease   . Tobacco abuse   . Vertigo      Social History   Social History  . Marital status: Married    Spouse name: N/A  . Number of children: N/A  . Years of education: N/A   Occupational History  . [employer] Pay[employer]    Social History Main Topics  . Smoking status: Current Some Day  Smoker    Types: Cigars    Last attempt to quit: 04/20/2015  . Smokeless tobacco: Never Used     Comment: 2 cigars a day  . Alcohol use 0.0 oz/week     Comment: occassional  . Drug use: No  . Sexual activity: Not on file   Other Topics Concern  . Not on file   Social History Narrative   Married.   No children.   Moved from [place] last August for job.   Vacations in [place] every year.   Enjoys reading in her free time.    Past Surgical History:  Procedure Laterality Date  . CYSTECTOMY     right brow and buttocks  . TONSILLECTOMY  1972    Family History  Problem Relation Age of Onset  . Cancer Father        Lung CA  . Arthritis Maternal Grandmother   . Hypothyroidism Sister   . Glaucoma Mother   . Hypothyroidism Mother     No Known Allergies  Current Outpatient Prescriptions on File Prior to Visit  Medication Sig Dispense Refill  . levothyroxine (SYNTHROID, LEVOTHROID) 25 MCG tablet TAKE 1 TABLET BY MOUTH EVERY DAY BEFORE BREAKFAST 30 tablet 11  . meloxicam (MOBIC) 15 MG tablet Take 1 tablet (15 mg total) by mouth daily as needed for pain. (Patient not taking: Reported on 04/25/2017) 90 tablet 0   No current facility-administered medications on file prior to visit.     BP 128/78   Pulse (!) 101  Temp 98 F (36.7 C) (Oral)   Ht 5\' 7"  (1.702 m)   Wt 163 lb 12.8 oz (74.3 kg)   SpO2 97%   BMI 25.65 kg/m    Objective:   Physical Exam  Constitutional: She is oriented to person, place, and time. She appears well-nourished.  HENT:  Right Ear: Tympanic membrane and ear canal normal.  Left Ear: Tympanic membrane and ear canal normal.  Nose: Nose normal.  Mouth/Throat: Oropharynx is clear and moist.  Eyes: Conjunctivae and EOM are normal. Pupils are equal, round, and reactive to light.  Neck: Neck supple. No thyromegaly present.  Cardiovascular: Normal rate and regular rhythm.   No murmur heard. Pulmonary/Chest: Effort normal and breath sounds normal. She has no  rales.  Abdominal: Soft. Bowel sounds are normal. There is no tenderness.  Musculoskeletal: Normal range of motion.  Lymphadenopathy:    She has no cervical adenopathy.  Neurological: She is alert and oriented to person, place, and time. She has normal reflexes. No cranial nerve deficit.  Skin: Skin is warm and dry. No rash noted.  Psychiatric: She has a normal mood and affect.          Assessment & Plan:

## 2017-05-09 ENCOUNTER — Other Ambulatory Visit: Payer: Self-pay | Admitting: Primary Care

## 2017-05-09 DIAGNOSIS — Z1231 Encounter for screening mammogram for malignant neoplasm of breast: Secondary | ICD-10-CM

## 2017-05-19 ENCOUNTER — Other Ambulatory Visit: Payer: Self-pay | Admitting: Primary Care

## 2017-05-19 DIAGNOSIS — M1991 Primary osteoarthritis, unspecified site: Secondary | ICD-10-CM

## 2017-05-29 ENCOUNTER — Ambulatory Visit
Admission: RE | Admit: 2017-05-29 | Discharge: 2017-05-29 | Disposition: A | Discharge status: Home / Self Care | Payer: Managed Care, Other (non HMO) | Source: Ambulatory Visit | Attending: Primary Care | Admitting: Primary Care

## 2017-05-29 DIAGNOSIS — Z1231 Encounter for screening mammogram for malignant neoplasm of breast: Secondary | ICD-10-CM

## 2017-05-29 DIAGNOSIS — E2839 Other primary ovarian failure: Secondary | ICD-10-CM

## 2017-10-15 ENCOUNTER — Other Ambulatory Visit: Payer: Self-pay | Admitting: Primary Care

## 2017-10-15 DIAGNOSIS — E785 Hyperlipidemia, unspecified: Secondary | ICD-10-CM

## 2017-10-15 DIAGNOSIS — R7303 Prediabetes: Secondary | ICD-10-CM

## 2017-10-15 NOTE — Assessment & Plan Note (Signed)
Present for the past 1-2 years. She is working on diet and exercise. Repeat A1C pending.

## 2017-10-17 ENCOUNTER — Other Ambulatory Visit: Payer: Self-pay | Admitting: Primary Care

## 2017-10-17 DIAGNOSIS — E039 Hypothyroidism, unspecified: Secondary | ICD-10-CM

## 2017-10-24 ENCOUNTER — Other Ambulatory Visit (INDEPENDENT_AMBULATORY_CARE_PROVIDER_SITE_OTHER): Payer: Managed Care, Other (non HMO)

## 2017-10-24 DIAGNOSIS — E785 Hyperlipidemia, unspecified: Secondary | ICD-10-CM

## 2017-10-24 DIAGNOSIS — R7303 Prediabetes: Secondary | ICD-10-CM

## 2017-10-24 LAB — HEMOGLOBIN A1C: HEMOGLOBIN A1C: 5.8 % (ref 4.6–6.5)

## 2017-10-24 LAB — LIPID PANEL
CHOL/HDL RATIO: 5
CHOLESTEROL: 234 mg/dL — AB (ref 0–200)
HDL: 50.4 mg/dL (ref >39.00)
LDL CALC: 156 mg/dL — AB (ref 0–99)
NONHDL: 183.85
Triglycerides: 139 mg/dL (ref 0.0–149.0)
VLDL: 27.8 mg/dL (ref 0.0–40.0)

## 2018-04-15 ENCOUNTER — Other Ambulatory Visit: Payer: Self-pay | Admitting: Primary Care

## 2018-04-15 DIAGNOSIS — E039 Hypothyroidism, unspecified: Secondary | ICD-10-CM

## 2018-04-24 ENCOUNTER — Other Ambulatory Visit: Payer: Self-pay | Admitting: Primary Care

## 2018-04-24 DIAGNOSIS — Z720 Tobacco use: Secondary | ICD-10-CM

## 2018-04-26 ENCOUNTER — Ambulatory Visit (INDEPENDENT_AMBULATORY_CARE_PROVIDER_SITE_OTHER): Payer: Managed Care, Other (non HMO) | Admitting: Primary Care

## 2018-04-26 ENCOUNTER — Encounter: Payer: Self-pay | Admitting: Primary Care

## 2018-04-26 VITALS — BP 124/76 | HR 98 | Temp 98.7°F | Ht 67.0 in | Wt 166.8 lb

## 2018-04-26 DIAGNOSIS — E782 Mixed hyperlipidemia: Secondary | ICD-10-CM

## 2018-04-26 DIAGNOSIS — Z720 Tobacco use: Secondary | ICD-10-CM | POA: Diagnosis not present

## 2018-04-26 DIAGNOSIS — Z1159 Encounter for screening for other viral diseases: Secondary | ICD-10-CM | POA: Diagnosis not present

## 2018-04-26 DIAGNOSIS — Z0000 Encounter for general adult medical examination without abnormal findings: Secondary | ICD-10-CM

## 2018-04-26 DIAGNOSIS — Z1239 Encounter for other screening for malignant neoplasm of breast: Secondary | ICD-10-CM

## 2018-04-26 DIAGNOSIS — I10 Essential (primary) hypertension: Secondary | ICD-10-CM

## 2018-04-26 DIAGNOSIS — Z1231 Encounter for screening mammogram for malignant neoplasm of breast: Secondary | ICD-10-CM | POA: Diagnosis not present

## 2018-04-26 DIAGNOSIS — R7303 Prediabetes: Secondary | ICD-10-CM | POA: Diagnosis not present

## 2018-04-26 DIAGNOSIS — E039 Hypothyroidism, unspecified: Secondary | ICD-10-CM | POA: Diagnosis not present

## 2018-04-26 DIAGNOSIS — M1991 Primary osteoarthritis, unspecified site: Secondary | ICD-10-CM

## 2018-04-26 LAB — HEMOGLOBIN A1C: HEMOGLOBIN A1C: 5.9 % (ref 4.6–6.5)

## 2018-04-26 LAB — LIPID PANEL
Cholesterol: 252 mg/dL — ABNORMAL HIGH (ref 0–200)
HDL: 45.5 mg/dL (ref >39.00)
LDL Cholesterol: 171 mg/dL — ABNORMAL HIGH (ref 0–99)
NONHDL: 206.94
Total CHOL/HDL Ratio: 6
Triglycerides: 179 mg/dL — ABNORMAL HIGH (ref 0.0–149.0)
VLDL: 35.8 mg/dL (ref 0.0–40.0)

## 2018-04-26 LAB — COMPREHENSIVE METABOLIC PANEL
ALBUMIN: 4.3 g/dL (ref 3.5–5.2)
ALK PHOS: 86 U/L (ref 39–117)
ALT: 13 U/L (ref 0–35)
AST: 15 U/L (ref 0–37)
BUN: 12 mg/dL (ref 6–23)
CHLORIDE: 106 meq/L (ref 96–112)
CO2: 26 mEq/L (ref 19–32)
Calcium: 9.9 mg/dL (ref 8.4–10.5)
Creatinine, Ser: 0.81 mg/dL (ref 0.40–1.20)
GFR: 76.1 mL/min (ref >60.00)
Glucose, Bld: 93 mg/dL (ref 70–99)
POTASSIUM: 4.2 meq/L (ref 3.5–5.1)
SODIUM: 140 meq/L (ref 135–145)
TOTAL PROTEIN: 7.2 g/dL (ref 6.0–8.3)
Total Bilirubin: 0.4 mg/dL (ref 0.2–1.2)

## 2018-04-26 LAB — TSH: TSH: 0.82 u[IU]/mL (ref 0.35–4.50)

## 2018-04-26 NOTE — Assessment & Plan Note (Signed)
Stable in the office today, continue to monitor off meds.

## 2018-04-26 NOTE — Assessment & Plan Note (Signed)
Restarted smoking in March 2019, plans to stop within the next 1-2 weeks. Referral placed for lung cancer screening. Continue Wellbutrin as this is helpful.

## 2018-04-26 NOTE — Assessment & Plan Note (Signed)
Immunizations UTD. Mammogram due this summer, orders placed. Pap smear UTD. Colonoscopy due in 2023. Discussed the importance of a healthy diet and regular exercise in order for weight loss, and to reduce the risk of any potential medical problems. Exam unremarkable. Labs pending. Follow up in 1 year for CPE.

## 2018-04-26 NOTE — Assessment & Plan Note (Signed)
Using Meloxicam PRN, continue same. Renal function pending.

## 2018-04-26 NOTE — Patient Instructions (Addendum)
Stop by the lab prior to leaving today. I will notify you of your results once received.   You will be contacted regarding your referral for lung cancer screening.  Please let us know if you have not been contacted within one week.   Schedule your mammogram as discussed.  Continue exercising. You should be getting 150 minutes of moderate intensity exercise weekly.  It's important to improve your diet by reducing consumption of fast food, fried food, processed snack foods, sugary drinks. Increase consumption of fresh vegetables and fruits, whole grains, water.  Ensure you are drinking 64 ounces of water daily.  Quit smoking!  Follow up in 1 year for your annual exam or sooner if needed.  It was a pleasure to see you today!   Preventive Care 40-64 Years, Female Preventive care refers to lifestyle choices and visits with your health care provider that can promote health and wellness. What does preventive care include?  A yearly physical exam. This is also called an annual well check.  Dental exams once or twice a year.  Routine eye exams. Ask your health care provider how often you should have your eyes checked.  Personal lifestyle choices, including: ? Daily care of your teeth and gums. ? Regular physical activity. ? Eating a healthy diet. ? Avoiding tobacco and drug use. ? Limiting alcohol use. ? Practicing safe sex. ? Taking low-dose aspirin daily starting at age 31. ? Taking vitamin and mineral supplements as recommended by your health care provider. What happens during an annual well check? The services and screenings done by your health care provider during your annual well check will depend on your age, overall health, lifestyle risk factors, and family history of disease. Counseling Your health care provider may ask you questions about your:  Alcohol use.  Tobacco use.  Drug use.  Emotional well-being.  Home and relationship well-being.  Sexual  activity.  Eating habits.  Work and work Statistician.  Method of birth control.  Menstrual cycle.  Pregnancy history.  Screening You may have the following tests or measurements:  Height, weight, and BMI.  Blood pressure.  Lipid and cholesterol levels. These may be checked every 5 years, or more frequently if you are over 41 years old.  Skin check.  Lung cancer screening. You may have this screening every year starting at age 21 if you have a 30-pack-year history of smoking and currently smoke or have quit within the past 15 years.  Fecal occult blood test (FOBT) of the stool. You may have this test every year starting at age 39.  Flexible sigmoidoscopy or colonoscopy. You may have a sigmoidoscopy every 5 years or a colonoscopy every 10 years starting at age 73.  Hepatitis C blood test.  Hepatitis B blood test.  Sexually transmitted disease (STD) testing.  Diabetes screening. This is done by checking your blood sugar (glucose) after you have not eaten for a while (fasting). You may have this done every 1-3 years.  Mammogram. This may be done every 1-2 years. Talk to your health care provider about when you should start having regular mammograms. This may depend on whether you have a family history of breast cancer.  BRCA-related cancer screening. This may be done if you have a family history of breast, ovarian, tubal, or peritoneal cancers.  Pelvic exam and Pap test. This may be done every 3 years starting at age 53. Starting at age 9, this may be done every 5 years if you have a  Pap test in combination with an HPV test.  Bone density scan. This is done to screen for osteoporosis. You may have this scan if you are at high risk for osteoporosis.  Discuss your test results, treatment options, and if necessary, the need for more tests with your health care provider. Vaccines Your health care provider may recommend certain vaccines, such as:  Influenza vaccine. This is  recommended every year.  Tetanus, diphtheria, and acellular pertussis (Tdap, Td) vaccine. You may need a Td booster every 10 years.  Varicella vaccine. You may need this if you have not been vaccinated.  Zoster vaccine. You may need this after age 37.  Measles, mumps, and rubella (MMR) vaccine. You may need at least one dose of MMR if you were born in 1957 or later. You may also need a second dose.  Pneumococcal 13-valent conjugate (PCV13) vaccine. You may need this if you have certain conditions and were not previously vaccinated.  Pneumococcal polysaccharide (PPSV23) vaccine. You may need one or two doses if you smoke cigarettes or if you have certain conditions.  Meningococcal vaccine. You may need this if you have certain conditions.  Hepatitis A vaccine. You may need this if you have certain conditions or if you travel or work in places where you may be exposed to hepatitis A.  Hepatitis B vaccine. You may need this if you have certain conditions or if you travel or work in places where you may be exposed to hepatitis B.  Haemophilus influenzae type b (Hib) vaccine. You may need this if you have certain conditions.  Talk to your health care provider about which screenings and vaccines you need and how often you need them. This information is not intended to replace advice given to you by your health care provider. Make sure you discuss any questions you have with your health care provider. Document Released: 12/18/2015 Document Revised: 08/10/2016 Document Reviewed: 09/22/2015 Elsevier Interactive Patient Education  [employer].

## 2018-04-26 NOTE — Assessment & Plan Note (Signed)
Repeat TSH pending. Continue levothyroxine at 25 mcg daily.

## 2018-04-26 NOTE — Progress Notes (Signed)
Subjective:    Patient ID: Gloria Neal, female    DOB: 1956/03/27, 62 y.o.   MRN: [id number]  HPI  Gloria Neal is a 62 year old female who presents today for complete physical.  Immunizations: -Tetanus: Completed in 2018 -Influenza: Completed last season -Pneumonia: Completed in 2016 -Shingles: Completed in 2017  Diet: She endorses a fair diet. Breakfast: Bagel with cream cheese, cereal, sometimes skips Lunch: Microwave food, left overs Dinner: Meat, vegetables Snacks: Oreo's, chips, ice cream Desserts: Daily  Beverages: Water, occasionally sweet tea  Exercise: Walking 30 minutes to 60 minutes 7 days weekly. Eye exam: Completed in 2019 Dental exam: Completes annually Colonoscopy: Completed in 2013, due in 2023 Pap Smear: Completed in 2017 Mammogram: Completed in 2018, due Hep C Screen: Due  BP Readings from Last 3 Encounters:  04/26/18 124/76  04/25/17 128/78  10/18/16 120/82     Review of Systems  Constitutional: Negative for unexpected weight change.  HENT: Negative for rhinorrhea.   Respiratory: Negative for cough and shortness of breath.   Cardiovascular: Negative for chest pain.  Gastrointestinal: Negative for constipation and diarrhea.  Genitourinary: Negative for difficulty urinating and menstrual problem.  Musculoskeletal: Negative for arthralgias and myalgias.  Skin: Negative for rash.  Allergic/Immunologic: Negative for environmental allergies.  Neurological: Negative for dizziness, numbness and headaches.  Psychiatric/Behavioral: The patient is not nervous/anxious.        Past Medical History:  Diagnosis Date  . Cervical dysplasia   . Heart murmur    no longer when she has younger  . Hypertension   . Mitral valve prolapse   . Rheumatic fever    62 years old  . Thyroid disease   . Tobacco abuse   . Vertigo      Social History   Socioeconomic History  . Marital status: Married    Spouse name: Not on file  . Number of children: Not on  file  . Years of education: Not on file  . Highest education level: Not on file  Occupational History  . Occupation: Accts Payable  Social Needs  . Financial resource strain: Not on file  . Food insecurity:    Worry: Not on file    Inability: Not on file  . Transportation needs:    Medical: Not on file    Non-medical: Not on file  Tobacco Use  . Smoking status: Current Some Day Smoker    Types: Cigars    Last attempt to quit: 04/20/2015    Years since quitting: 3.0  . Smokeless tobacco: Never Used  . Tobacco comment: 2 cigars a day  Substance and Sexual Activity  . Alcohol use: Yes    Alcohol/week: 0.0 oz    Comment: occassional  . Drug use: No  . Sexual activity: Not on file  Lifestyle  . Physical activity:    Days per week: Not on file    Minutes per session: Not on file  . Stress: Not on file  Relationships  . Social connections:    Talks on phone: Not on file    Gets together: Not on file    Attends religious service: Not on file    Active member of club or organization: Not on file    Attends meetings of clubs or organizations: Not on file    Relationship status: Not on file  . Intimate partner violence:    Fear of current or ex partner: Not on file    Emotionally abused: Not on file  Physically abused: Not on file    Forced sexual activity: Not on file  Other Topics Concern  . Not on file  Social History Narrative   Married.   No children.   Moved from [place] last August for job.   Vacations in [place] every year.   Enjoys reading in her free time.    Past Surgical History:  Procedure Laterality Date  . CYSTECTOMY     right brow and buttocks  . TONSILLECTOMY  1972    Family History  Problem Relation Age of Onset  . Glaucoma Mother   . Hypothyroidism Mother   . Cancer Father        Lung CA  . Arthritis Maternal Grandmother   . Hypothyroidism Sister     No Known Allergies  Current Outpatient Medications on File Prior to Visit  Medication Sig  Dispense Refill  . buPROPion (WELLBUTRIN SR) 150 MG 12 hr tablet TAKE 1 TABLET(150 MG) BY MOUTH DAILY 90 tablet 0  . levothyroxine (SYNTHROID, LEVOTHROID) 25 MCG tablet TAKE 1 TABLET BY MOUTH EVERY DAY BEFORE BREAKFAST 30 tablet 0  . meloxicam (MOBIC) 15 MG tablet TAKE 1 TABLET(15 MG) BY MOUTH DAILY AS NEEDED FOR PAIN 90 tablet 1   No current facility-administered medications on file prior to visit.     BP 124/76   Pulse 98   Temp 98.7 F (37.1 C) (Oral)   Ht 5\' 7"  (1.702 m)   Wt 166 lb 12 oz (75.6 kg)   SpO2 98%   BMI 26.12 kg/m    Objective:   Physical Exam  Constitutional: She is oriented to person, place, and time. She appears well-nourished.  HENT:  Mouth/Throat: No oropharyngeal exudate.  Eyes: Pupils are equal, round, and reactive to light. EOM are normal.  Neck: Neck supple. No thyromegaly present.  Cardiovascular: Normal rate and regular rhythm.  Respiratory: Effort normal and breath sounds normal.  GI: Soft. Bowel sounds are normal. There is no tenderness.  Musculoskeletal: Normal range of motion.  Neurological: She is alert and oriented to person, place, and time.  Skin: Skin is warm and dry.  Psychiatric: She has a normal mood and affect.           Assessment & Plan:

## 2018-04-26 NOTE — Assessment & Plan Note (Signed)
Repeat lipids pending.  Encouraged to work on diet and continue exercise.

## 2018-04-26 NOTE — Assessment & Plan Note (Signed)
Repeat A1C pending today. She has been stable throughout the years so if pending A1C is stable, then will monitor annually.

## 2018-04-27 ENCOUNTER — Other Ambulatory Visit: Payer: Self-pay | Admitting: Internal Medicine

## 2018-04-27 ENCOUNTER — Encounter: Payer: Managed Care, Other (non HMO) | Admitting: Primary Care

## 2018-04-27 DIAGNOSIS — R768 Other specified abnormal immunological findings in serum: Secondary | ICD-10-CM

## 2018-04-27 NOTE — Progress Notes (Signed)
Am

## 2018-05-01 ENCOUNTER — Telehealth: Payer: Self-pay | Admitting: *Deleted

## 2018-05-01 DIAGNOSIS — Z122 Encounter for screening for malignant neoplasm of respiratory organs: Secondary | ICD-10-CM

## 2018-05-01 DIAGNOSIS — F17210 Nicotine dependence, cigarettes, uncomplicated: Principal | ICD-10-CM

## 2018-05-01 LAB — HEPATITIS C ANTIBODY
Hepatitis C Ab: REACTIVE — AB
SIGNAL TO CUT-OFF: 2.52 — ABNORMAL HIGH (ref <1.00)

## 2018-05-01 LAB — HCV RNA,QUANTITATIVE REAL TIME PCR
HCV Quantitative Log: <1.18 Log IU/mL
HCV RNA, PCR, QN: NOT DETECTED [IU]/mL

## 2018-05-01 NOTE — Telephone Encounter (Signed)
Spoke with pt and scheduled SDMV 05/11/18 3:00 CT ordered Nothing further needed

## 2018-05-11 ENCOUNTER — Ambulatory Visit (INDEPENDENT_AMBULATORY_CARE_PROVIDER_SITE_OTHER): Payer: Managed Care, Other (non HMO) | Admitting: Acute Care

## 2018-05-11 ENCOUNTER — Encounter: Payer: Self-pay | Admitting: Acute Care

## 2018-05-11 ENCOUNTER — Ambulatory Visit (INDEPENDENT_AMBULATORY_CARE_PROVIDER_SITE_OTHER)
Admission: RE | Admit: 2018-05-11 | Discharge: 2018-05-11 | Disposition: A | Discharge status: Home / Self Care | Payer: Managed Care, Other (non HMO) | Source: Ambulatory Visit | Attending: Acute Care | Admitting: Acute Care

## 2018-05-11 DIAGNOSIS — F17210 Nicotine dependence, cigarettes, uncomplicated: Secondary | ICD-10-CM

## 2018-05-11 DIAGNOSIS — Z122 Encounter for screening for malignant neoplasm of respiratory organs: Secondary | ICD-10-CM

## 2018-05-11 NOTE — Progress Notes (Signed)
Shared Decision Making Visit Lung Cancer Screening Program [phone])   Eligibility:  Age 62 y.o.  Pack Years Smoking History Calculation 62 pack year smoking history (# packs/per year x # years smoked)  Recent History of coughing up blood  no  Unexplained weight loss? no ( >Than 15 pounds within the last 6 months )  Prior History Lung / other cancer no (Diagnosis within the last 5 years already requiring surveillance chest CT Scans).  Smoking Status Current Smoker  Former Smokers: Years since quit: NA  Quit Date: NA  Visit Components:  Discussion included one or more decision making aids. yes  Discussion included risk/benefits of screening. yes  Discussion included potential follow up diagnostic testing for abnormal scans. yes  Discussion included meaning and risk of over diagnosis. yes  Discussion included meaning and risk of False Positives. yes  Discussion included meaning of total radiation exposure. yes  Counseling Included:  Importance of adherence to annual lung cancer LDCT screening. yes  Impact of comorbidities on ability to participate in the program. yes  Ability and willingness to under diagnostic treatment. yes  Smoking Cessation Counseling:  Current Smokers:   Discussed importance of smoking cessation. yes  Information about tobacco cessation classes and interventions provided to patient. yes  Patient provided with "ticket" for LDCT Scan. yes  Symptomatic Patient. no  Counseling  Diagnosis Code: Tobacco Use Z72.0  Asymptomatic Patient yes  Counseling (Intermediate counseling: > three minutes counseling) T2458  Former Smokers:   Discussed the importance of maintaining cigarette abstinence. yes  Diagnosis Code: Personal History of Nicotine Dependence. K99.833  Information about tobacco cessation classes and interventions provided to patient. Yes  Patient provided with "ticket" for LDCT Scan. yes  Written Order for Lung Cancer  Screening with LDCT placed in Epic. Yes (CT Chest Lung Cancer Screening Low Dose W/O CM) [id number] Z12.2-Screening of respiratory organs Z87.891-Personal history of nicotine dependence  I have spent 25 minutes of face to face time with Gloria Neal discussing the risks and benefits of lung cancer screening. We viewed a power point together that explained in detail the above noted topics. We paused at intervals to allow for questions to be asked and answered to ensure understanding.We discussed that the single most powerful action that she can take to decrease her risk of developing lung cancer is to quit smoking. We discussed whether or not she is ready to commit to setting a quit date. We discussed options for tools to aid in quitting smoking including nicotine replacement therapy, non-nicotine medications, support groups, Quit Smart classes, and behavior modification. We discussed that often times setting smaller, more achievable goals, such as eliminating 1 cigarette a day for a week and then 2 cigarettes a day for a week can be helpful in slowly decreasing the number of cigarettes smoked. This allows for a sense of accomplishment as well as providing a clinical benefit. I gave her the " Be Stronger Than Your Excuses" card with contact information for community resources, classes, free nicotine replacement therapy, and access to mobile apps, text messaging, and on-line smoking cessation help. I have also given her my card and contact information in the event she needs to contact me. We discussed the time and location of the scan, and that either Doroteo Glassman RN or I will call with the results within 24-48 hours of receiving them. I have offered her  a copy of the power point we viewed  as a resource in the event they need reinforcement of  the concepts we discussed today in the office. The patient verbalized understanding of all of  the above and had no further questions upon leaving the office. They have my  contact information in the event they have any further questions.  I spent 4 minutes counseling on smoking cessation and the health risks of continued tobacco abuse.  I explained to the patient that there has been a high incidence of coronary artery disease noted on these exams. I explained that this is a non-gated exam therefore degree or severity cannot be determined. This patient is not on statin therapy. I have asked the patient to follow-up with their PCP regarding any incidental finding of coronary artery disease and management with diet or medication as their PCP  feels is clinically indicated. The patient verbalized understanding of the above and had no further questions upon completion of the visit.      Magdalen Spatz, NP 05/11/2018 3:53 PM

## 2018-05-11 NOTE — Addendum Note (Signed)
Addended by: Eric Form F on: 05/11/2018 04:10 PM   Modules accepted: Level of Service

## 2018-05-16 ENCOUNTER — Other Ambulatory Visit: Payer: Self-pay | Admitting: Primary Care

## 2018-05-16 ENCOUNTER — Other Ambulatory Visit: Payer: Self-pay | Admitting: Acute Care

## 2018-05-16 DIAGNOSIS — Z122 Encounter for screening for malignant neoplasm of respiratory organs: Secondary | ICD-10-CM

## 2018-05-16 DIAGNOSIS — E039 Hypothyroidism, unspecified: Secondary | ICD-10-CM

## 2018-05-16 DIAGNOSIS — F17210 Nicotine dependence, cigarettes, uncomplicated: Principal | ICD-10-CM

## 2018-06-29 ENCOUNTER — Encounter: Payer: Self-pay | Admitting: Primary Care

## 2018-06-29 DIAGNOSIS — E785 Hyperlipidemia, unspecified: Secondary | ICD-10-CM

## 2018-06-29 MED ORDER — ATORVASTATIN CALCIUM 20 MG PO TABS: ORAL_TABLET | ORAL | 3 refills | Status: DC

## 2018-07-21 ENCOUNTER — Other Ambulatory Visit: Payer: Self-pay | Admitting: Primary Care

## 2018-07-21 DIAGNOSIS — Z720 Tobacco use: Secondary | ICD-10-CM

## 2018-08-07 ENCOUNTER — Other Ambulatory Visit (INDEPENDENT_AMBULATORY_CARE_PROVIDER_SITE_OTHER): Payer: Managed Care, Other (non HMO)

## 2018-08-07 ENCOUNTER — Encounter: Payer: Self-pay | Admitting: Primary Care

## 2018-08-07 ENCOUNTER — Ambulatory Visit (INDEPENDENT_AMBULATORY_CARE_PROVIDER_SITE_OTHER): Payer: Managed Care, Other (non HMO) | Admitting: Primary Care

## 2018-08-07 ENCOUNTER — Ambulatory Visit (INDEPENDENT_AMBULATORY_CARE_PROVIDER_SITE_OTHER)
Admission: RE | Admit: 2018-08-07 | Discharge: 2018-08-07 | Disposition: A | Discharge status: Home / Self Care | Payer: Managed Care, Other (non HMO) | Source: Ambulatory Visit | Attending: Primary Care | Admitting: Primary Care

## 2018-08-07 VITALS — BP 122/84 | HR 81 | Temp 98.0°F | Ht 67.0 in | Wt 156.5 lb

## 2018-08-07 DIAGNOSIS — M25511 Pain in right shoulder: Secondary | ICD-10-CM | POA: Insufficient documentation

## 2018-08-07 DIAGNOSIS — Z23 Encounter for immunization: Secondary | ICD-10-CM

## 2018-08-07 DIAGNOSIS — M25512 Pain in left shoulder: Secondary | ICD-10-CM

## 2018-08-07 DIAGNOSIS — E785 Hyperlipidemia, unspecified: Secondary | ICD-10-CM

## 2018-08-07 DIAGNOSIS — M25519 Pain in unspecified shoulder: Secondary | ICD-10-CM | POA: Insufficient documentation

## 2018-08-07 HISTORY — DX: Pain in right shoulder: M25.511

## 2018-08-07 HISTORY — DX: Pain in unspecified shoulder: M25.519

## 2018-08-07 LAB — HEPATIC FUNCTION PANEL
ALT: 17 U/L (ref 0–35)
AST: 17 U/L (ref 0–37)
Albumin: 4.1 g/dL (ref 3.5–5.2)
Alkaline Phosphatase: 81 U/L (ref 39–117)
BILIRUBIN DIRECT: 0.1 mg/dL (ref 0.0–0.3)
BILIRUBIN TOTAL: 0.5 mg/dL (ref 0.2–1.2)
TOTAL PROTEIN: 6.7 g/dL (ref 6.0–8.3)

## 2018-08-07 LAB — LIPID PANEL
CHOL/HDL RATIO: 3
Cholesterol: 133 mg/dL (ref 0–200)
HDL: 40.8 mg/dL (ref >39.00)
LDL CALC: 64 mg/dL (ref 0–99)
NonHDL: 92.36
Triglycerides: 144 mg/dL (ref 0.0–149.0)
VLDL: 28.8 mg/dL (ref 0.0–40.0)

## 2018-08-07 NOTE — Patient Instructions (Signed)
Complete xray(s) prior to leaving today. I will notify you of your results once received.  Start taking your Meloxicam once daily for the next 7-14 days. Okay to use Tylenol as needed for breakthrough pain.  Please update me in one week  It was a pleasure to see you today! Congratulations on smoking cessation!!   Shoulder Exercises Ask your health care provider which exercises are safe for you. Do exercises exactly as told by your health care provider and adjust them as directed. It is normal to feel mild stretching, pulling, tightness, or discomfort as you do these exercises, but you should stop right away if you feel sudden pain or your pain gets worse.Do not begin these exercises until told by your health care provider. RANGE OF MOTION EXERCISES These exercises warm up your muscles and joints and improve the movement and flexibility of your shoulder. These exercises also help to relieve pain, numbness, and tingling. These exercises involve stretching your injured shoulder directly. Exercise A: Pendulum  1. Stand near a wall or a surface that you can hold onto for balance. 2. Bend at the waist and let your left / right arm hang straight down. Use your other arm to support you. Keep your back straight and do not lock your knees. 3. Relax your left / right arm and shoulder muscles, and move your hips and your trunk so your left / right arm swings freely. Your arm should swing because of the motion of your body, not because you are using your arm or shoulder muscles. 4. Keep moving your body so your arm swings in the following directions, as told by your health care provider: ? Side to side. ? Forward and backward. ? In clockwise and counterclockwise circles. 5. Continue each motion for __________ seconds, or for as long as told by your health care provider. 6. Slowly return to the starting position. Repeat __________ times. Complete this exercise __________ times a day. Exercise B:Flexion,  Standing  1. Stand and hold a broomstick, a cane, or a similar object. Place your hands a little more than shoulder-width apart on the object. Your left / right hand should be palm-up, and your other hand should be palm-down. 2. Keep your elbow straight and keep your shoulder muscles relaxed. Push the stick down with your healthy arm to raise your left / right arm in front of your body, and then over your head until you feel a stretch in your shoulder. ? Avoid shrugging your shoulder while you raise your arm. Keep your shoulder blade tucked down toward the middle of your back. 3. Hold for __________ seconds. 4. Slowly return to the starting position. Repeat __________ times. Complete this exercise __________ times a day. Exercise C: Abduction, Standing 1. Stand and hold a broomstick, a cane, or a similar object. Place your hands a little more than shoulder-width apart on the object. Your left / right hand should be palm-up, and your other hand should be palm-down. 2. While keeping your elbow straight and your shoulder muscles relaxed, push the stick across your body toward your left / right side. Raise your left / right arm to the side of your body and then over your head until you feel a stretch in your shoulder. ? Do not raise your arm above shoulder height, unless your health care provider tells you to do that. ? Avoid shrugging your shoulder while you raise your arm. Keep your shoulder blade tucked down toward the middle of your back. 3. Hold  for __________ seconds. 4. Slowly return to the starting position. Repeat __________ times. Complete this exercise __________ times a day. Exercise D:Internal Rotation  1. Place your left / right hand behind your back, palm-up. 2. Use your other hand to dangle an exercise band, a towel, or a similar object over your shoulder. Grasp the band with your left / right hand so you are holding onto both ends. 3. Gently pull up on the band until you feel a  stretch in the front of your left / right shoulder. ? Avoid shrugging your shoulder while you raise your arm. Keep your shoulder blade tucked down toward the middle of your back. 4. Hold for __________ seconds. 5. Release the stretch by letting go of the band and lowering your hands. Repeat __________ times. Complete this exercise __________ times a day. STRETCHING EXERCISES These exercises warm up your muscles and joints and improve the movement and flexibility of your shoulder. These exercises also help to relieve pain, numbness, and tingling. These exercises are done using your healthy shoulder to help stretch the muscles of your injured shoulder. Exercise E: Warehouse manager (External Rotation and Abduction)  1. Stand in a doorway with one of your feet slightly in front of the other. This is called a staggered stance. If you cannot reach your forearms to the door frame, stand facing a corner of a room. 2. Choose one of the following positions as told by your health care provider: ? Place your hands and forearms on the door frame above your head. ? Place your hands and forearms on the door frame at the height of your head. ? Place your hands on the door frame at the height of your elbows. 3. Slowly move your weight onto your front foot until you feel a stretch across your chest and in the front of your shoulders. Keep your head and chest upright and keep your abdominal muscles tight. 4. Hold for __________ seconds. 5. To release the stretch, shift your weight to your back foot. Repeat __________ times. Complete this stretch __________ times a day. Exercise F:Extension, Standing 1. Stand and hold a broomstick, a cane, or a similar object behind your back. ? Your hands should be a little wider than shoulder-width apart. ? Your palms should face away from your back. 2. Keeping your elbows straight and keeping your shoulder muscles relaxed, move the stick away from your body until you feel a  stretch in your shoulder. ? Avoid shrugging your shoulders while you move the stick. Keep your shoulder blade tucked down toward the middle of your back. 3. Hold for __________ seconds. 4. Slowly return to the starting position. Repeat __________ times. Complete this exercise __________ times a day. STRENGTHENING EXERCISES These exercises build strength and endurance in your shoulder. Endurance is the ability to use your muscles for a long time, even after they get tired. Exercise G:External Rotation  1. Sit in a stable chair without armrests. 2. Secure an exercise band at elbow height on your left / right side. 3. Place a soft object, such as a folded towel or a small pillow, between your left / right upper arm and your body to move your elbow a few inches away (about 10 cm) from your side. 4. Hold the end of the band so it is tight and there is no slack. 5. Keeping your elbow pressed against the soft object, move your left / right forearm out, away from your abdomen. Keep your body steady so only  your forearm moves. 6. Hold for __________ seconds. 7. Slowly return to the starting position. Repeat __________ times. Complete this exercise __________ times a day. Exercise H:Shoulder Abduction  1. Sit in a stable chair without armrests, or stand. 2. Hold a __________ weight in your left / right hand, or hold an exercise band with both hands. 3. Start with your arms straight down and your left / right palm facing in, toward your body. 4. Slowly lift your left / right hand out to your side. Do not lift your hand above shoulder height unless your health care provider tells you that this is safe. ? Keep your arms straight. ? Avoid shrugging your shoulder while you do this movement. Keep your shoulder blade tucked down toward the middle of your back. 5. Hold for __________ seconds. 6. Slowly lower your arm, and return to the starting position. Repeat __________ times. Complete this exercise  __________ times a day. Exercise I:Shoulder Extension 1. Sit in a stable chair without armrests, or stand. 2. Secure an exercise band to a stable object in front of you where it is at shoulder height. 3. Hold one end of the exercise band in each hand. Your palms should face each other. 4. Straighten your elbows and lift your hands up to shoulder height. 5. Step back, away from the secured end of the exercise band, until the band is tight and there is no slack. 6. Squeeze your shoulder blades together as you pull your hands down to the sides of your thighs. Stop when your hands are straight down by your sides. Do not let your hands go behind your body. 7. Hold for __________ seconds. 8. Slowly return to the starting position. Repeat __________ times. Complete this exercise __________ times a day. Exercise J:Standing Shoulder Row 1. Sit in a stable chair without armrests, or stand. 2. Secure an exercise band to a stable object in front of you so it is at waist height. 3. Hold one end of the exercise band in each hand. Your palms should be in a thumbs-up position. 4. Bend each of your elbows to an "L" shape (about 90 degrees) and keep your upper arms at your sides. 5. Step back until the band is tight and there is no slack. 6. Slowly pull your elbows back behind you. 7. Hold for __________ seconds. 8. Slowly return to the starting position. Repeat __________ times. Complete this exercise __________ times a day. Exercise K:Shoulder Press-Ups  1. Sit in a stable chair that has armrests. Sit upright, with your feet flat on the floor. 2. Put your hands on the armrests so your elbows are bent and your fingers are pointing forward. Your hands should be about even with the sides of your body. 3. Push down on the armrests and use your arms to lift yourself off of the chair. Straighten your elbows and lift yourself up as much as you comfortably can. ? Move your shoulder blades down, and avoid  letting your shoulders move up toward your ears. ? Keep your feet on the ground. As you get stronger, your feet should support less of your body weight as you lift yourself up. 4. Hold for __________ seconds. 5. Slowly lower yourself back into the chair. Repeat __________ times. Complete this exercise __________ times a day. Exercise L: Wall Push-Ups  1. Stand so you are facing a stable wall. Your feet should be about one arm-length away from the wall. 2. Lean forward and place your palms on the  wall at shoulder height. 3. Keep your feet flat on the floor as you bend your elbows and lean forward toward the wall. 4. Hold for __________ seconds. 5. Straighten your elbows to push yourself back to the starting position. Repeat __________ times. Complete this exercise __________ times a day. This information is not intended to replace advice given to you by your health care provider. Make sure you discuss any questions you have with your health care provider. Document Released: 10/05/2005 Document Revised: 08/15/2016 Document Reviewed: 08/02/2015 Elsevier Interactive Patient Education  2018 [employer].

## 2018-08-07 NOTE — Addendum Note (Signed)
Addended by: Pleas [name] on: 08/07/2018 02:58 PM   Modules accepted: Orders

## 2018-08-07 NOTE — Progress Notes (Signed)
Subjective:    Patient ID: Gloria Neal, female    DOB: 22-Jun-1956, 62 y.o.   MRN: [id number]  HPI  Gloria Neal is a 62 year old female with a history of osteoarthritis who presents today with a chief complaint of shoulder pain.  Her pain is located to the left lateral shoulder which she first noticed two months ago. She has decrease in ROM with pain in most planes of range of motion. She does have intermittent radiation of pain down her left upper extremity. She denies recent injury/trauma, numbness/tingling, weakness. She thinks that walking her dog with her left arm may have caused symptoms as her dog is nearly 60 pounds and often jerks the leash.   She's been taking Meloxicam and Tylenol infrequently for her shoulder, is not sure if it helps.   Review of Systems  Musculoskeletal:       Left shoulder pain  Skin: Negative for color change.  Neurological: Negative for weakness and numbness.       Past Medical History:  Diagnosis Date  . Cervical dysplasia   . Heart murmur    no longer when she has younger  . Hypertension   . Mitral valve prolapse   . Rheumatic fever    62 years old  . Thyroid disease   . Tobacco abuse   . Vertigo      Social History   Socioeconomic History  . Marital status: Married    Spouse name: Not on file  . Number of children: Not on file  . Years of education: Not on file  . Highest education level: Not on file  Occupational History  . Occupation: Ac[employer]s [employer]  Social Needs  . Financial resource strain: Not on file  . Food insecurity:    Worry: Not on file    Inability: Not on file  . Transportation needs:    Medical: Not on file    Non-medical: Not on file  Tobacco Use  . Smoking status: Current Some Day Smoker    Packs/day: 1.50    Years: 44.00    Pack years: 66.00    Types: Cigars    Last attempt to quit: 04/20/2015    Years since quitting: 3.3  . Smokeless tobacco: Never Used  . Tobacco comment: 2 cigars a day  Substance and  Sexual Activity  . Alcohol use: Yes    Alcohol/week: 0.0 standard drinks    Comment: occassional  . Drug use: No  . Sexual activity: Not on file  Lifestyle  . Physical activity:    Days per week: Not on file    Minutes per session: Not on file  . Stress: Not on file  Relationships  . Social connections:    Talks on phone: Not on file    Gets together: Not on file    Attends religious service: Not on file    Active member of club or organization: Not on file    Attends meetings of clubs or organizations: Not on file    Relationship status: Not on file  . Intimate partner violence:    Fear of current or ex partner: Not on file    Emotionally abused: Not on file    Physically abused: Not on file    Forced sexual activity: Not on file  Other Topics Concern  . Not on file  Social History Narrative   Married.   No children.   Moved from [place] last August for job.   Vacations in  [place] every year.   Enjoys reading in her free time.    Past Surgical History:  Procedure Laterality Date  . CYSTECTOMY     right brow and buttocks  . TONSILLECTOMY  1972    Family History  Problem Relation Age of Onset  . Glaucoma Mother   . Hypothyroidism Mother   . Cancer Father        Lung CA  . Arthritis Maternal Grandmother   . Hypothyroidism Sister     No Known Allergies  Current Outpatient Medications on File Prior to Visit  Medication Sig Dispense Refill  . atorvastatin (LIPITOR) 20 MG tablet Take 1 tablet by mouth every evening for cholesterol. 90 tablet 3  . buPROPion (WELLBUTRIN SR) 150 MG 12 hr tablet TAKE 1 TABLET(150 MG) BY MOUTH DAILY (Patient taking differently: Take 150 mg by mouth 2 (two) times daily. ) 90 tablet 1  . levothyroxine (SYNTHROID, LEVOTHROID) 25 MCG tablet TAKE 1 TABLET BY MOUTH EVERY DAY BEFORE BREAKFAST 90 tablet 1  . meloxicam (MOBIC) 15 MG tablet TAKE 1 TABLET(15 MG) BY MOUTH DAILY AS NEEDED FOR PAIN 90 tablet 1   No current facility-administered medications  on file prior to visit.     BP 122/84   Pulse 81   Temp 98 F (36.7 C) (Oral)   Ht 5\' 7"  (1.702 m)   Wt 156 lb 8 oz (71 kg)   SpO2 96%   BMI 24.51 kg/m    Objective:   Physical Exam  Constitutional: She appears well-nourished.  Cardiovascular: Normal rate.  Respiratory: Effort normal.  Musculoskeletal:       Left shoulder: She exhibits decreased range of motion and pain. She exhibits no tenderness, no swelling, no spasm and normal strength.  Decrease in ROM with adduction and abduction in most plans of ROM. Strength equal bilaterally. Negative empty can test.  Skin: Skin is warm and dry.           Assessment & Plan:

## 2018-08-07 NOTE — Addendum Note (Signed)
Addended by: Jacqualin Combes on: 08/07/2018 03:16 PM   Modules accepted: Orders

## 2018-08-07 NOTE — Assessment & Plan Note (Signed)
Located to left shoulder occurring 2 months ago. Exam today suspicious for adhesive capsulitis, do not suspect rotator cuff involvement at this point.  Start with plain films of shoulder.  Will have her take her Meloxicam daily for the next 7-10 days. Also discussed stretching, heat/ice. Consider PT vs sports medicine evaluation thereafter if no improvement.

## 2018-11-09 ENCOUNTER — Other Ambulatory Visit: Payer: Self-pay | Admitting: Primary Care

## 2018-11-09 DIAGNOSIS — E039 Hypothyroidism, unspecified: Secondary | ICD-10-CM

## 2019-01-26 ENCOUNTER — Other Ambulatory Visit: Payer: Self-pay | Admitting: Primary Care

## 2019-01-26 DIAGNOSIS — Z720 Tobacco use: Secondary | ICD-10-CM

## 2019-01-28 NOTE — Telephone Encounter (Signed)
Noted, refill sent to pharmacy. 

## 2019-01-28 NOTE — Telephone Encounter (Signed)
Last prescribed on 07/23/2018   . Last office visit on 08/07/2018. Next future appointment on 05/02/2019  I did call patient and she stated that she was taking 1 tablet once a day. However, trying to quit again. Asking if [name] can prescribed Rx for 1 tablet twice a day.

## 2019-04-24 ENCOUNTER — Other Ambulatory Visit: Payer: Self-pay | Admitting: Primary Care

## 2019-04-24 ENCOUNTER — Telehealth: Payer: Self-pay | Admitting: Primary Care

## 2019-04-24 DIAGNOSIS — I10 Essential (primary) hypertension: Secondary | ICD-10-CM

## 2019-04-24 DIAGNOSIS — E785 Hyperlipidemia, unspecified: Secondary | ICD-10-CM

## 2019-04-24 DIAGNOSIS — E039 Hypothyroidism, unspecified: Secondary | ICD-10-CM

## 2019-04-24 DIAGNOSIS — R7303 Prediabetes: Secondary | ICD-10-CM

## 2019-04-24 NOTE — Telephone Encounter (Signed)
Called to r/s cpe labs and cpe. Lvm asking pt to call office.

## 2019-04-26 ENCOUNTER — Other Ambulatory Visit (INDEPENDENT_AMBULATORY_CARE_PROVIDER_SITE_OTHER): Payer: Managed Care, Other (non HMO)

## 2019-04-26 ENCOUNTER — Other Ambulatory Visit: Payer: Managed Care, Other (non HMO)

## 2019-04-26 DIAGNOSIS — E785 Hyperlipidemia, unspecified: Secondary | ICD-10-CM

## 2019-04-26 DIAGNOSIS — E039 Hypothyroidism, unspecified: Secondary | ICD-10-CM | POA: Diagnosis not present

## 2019-04-26 DIAGNOSIS — I10 Essential (primary) hypertension: Secondary | ICD-10-CM | POA: Diagnosis not present

## 2019-04-26 DIAGNOSIS — R7303 Prediabetes: Secondary | ICD-10-CM

## 2019-04-26 LAB — HEMOGLOBIN A1C: Hgb A1c MFr Bld: 5.9 % (ref 4.6–6.5)

## 2019-04-26 LAB — LIPID PANEL
Cholesterol: 133 mg/dL (ref 0–200)
HDL: 51.2 mg/dL (ref >39.00)
LDL Cholesterol: 62 mg/dL (ref 0–99)
NonHDL: 81.58
Total CHOL/HDL Ratio: 3
Triglycerides: 96 mg/dL (ref 0.0–149.0)
VLDL: 19.2 mg/dL (ref 0.0–40.0)

## 2019-04-26 LAB — COMPREHENSIVE METABOLIC PANEL
ALT: 14 U/L (ref 0–35)
AST: 13 U/L (ref 0–37)
Albumin: 3.8 g/dL (ref 3.5–5.2)
Alkaline Phosphatase: 73 U/L (ref 39–117)
BUN: 12 mg/dL (ref 6–23)
CO2: 28 mEq/L (ref 19–32)
Calcium: 9.2 mg/dL (ref 8.4–10.5)
Chloride: 110 mEq/L (ref 96–112)
Creatinine, Ser: 0.89 mg/dL (ref 0.40–1.20)
GFR: 64.02 mL/min (ref >60.00)
Glucose, Bld: 96 mg/dL (ref 70–99)
Potassium: 4.7 mEq/L (ref 3.5–5.1)
Sodium: 144 mEq/L (ref 135–145)
Total Bilirubin: 0.5 mg/dL (ref 0.2–1.2)
Total Protein: 6.4 g/dL (ref 6.0–8.3)

## 2019-04-26 LAB — TSH: TSH: 1.88 u[IU]/mL (ref 0.35–4.50)

## 2019-05-02 ENCOUNTER — Encounter: Payer: Self-pay | Admitting: Primary Care

## 2019-05-02 ENCOUNTER — Ambulatory Visit: Payer: Managed Care, Other (non HMO) | Admitting: Primary Care

## 2019-05-02 ENCOUNTER — Telehealth (INDEPENDENT_AMBULATORY_CARE_PROVIDER_SITE_OTHER): Payer: Managed Care, Other (non HMO) | Admitting: Primary Care

## 2019-05-02 DIAGNOSIS — Z0000 Encounter for general adult medical examination without abnormal findings: Secondary | ICD-10-CM

## 2019-05-02 DIAGNOSIS — Z720 Tobacco use: Secondary | ICD-10-CM

## 2019-05-02 DIAGNOSIS — Z1239 Encounter for other screening for malignant neoplasm of breast: Secondary | ICD-10-CM | POA: Diagnosis not present

## 2019-05-02 DIAGNOSIS — E785 Hyperlipidemia, unspecified: Secondary | ICD-10-CM

## 2019-05-02 DIAGNOSIS — E039 Hypothyroidism, unspecified: Secondary | ICD-10-CM | POA: Diagnosis not present

## 2019-05-02 DIAGNOSIS — R7303 Prediabetes: Secondary | ICD-10-CM

## 2019-05-02 DIAGNOSIS — M1991 Primary osteoarthritis, unspecified site: Secondary | ICD-10-CM

## 2019-05-02 DIAGNOSIS — I10 Essential (primary) hypertension: Secondary | ICD-10-CM

## 2019-05-02 NOTE — Assessment & Plan Note (Signed)
Quit smoking again in the end of March 2020, continues taking Wellbutrin BID and is doing well.

## 2019-05-02 NOTE — Assessment & Plan Note (Signed)
Intermittent, using Meloxicam sparingly. Continue to monitor.

## 2019-05-02 NOTE — Progress Notes (Signed)
Subjective:    Patient ID: Gloria Neal, female    DOB: 25-May-1956, 63 y.o.   MRN: [id number]  HPI  Virtual Visit via Video Note  I connected with Gloria Neal on 05/02/19 at  8:40 AM EDT by a video enabled telemedicine application and verified that I am speaking with the correct person using two identifiers.  Location: Patient: Home Provider: Office   I discussed the limitations of evaluation and management by telemedicine and the availability of in person appointments. The patient expressed understanding and agreed to proceed.  History of Present Illness:  Gloria Neal is a 63 year old female who presents today for complete physical.  Immunizations: -Tetanus: Completed in 2018 -Influenza: Due this season  -Pneumonia: Completed in 2016 -Shingles: Completed Zostavax in 2017  Diet: She endorses a fair diet. She is cooking a lot at home, hardly doing take out food. She endorses some fruit and vegetable consumption, drinking some water.   Exercise: She is walking some daily, no vigorous exercise.  Eye exam: Due next week Dental exam: Completes semi-annually  Colonoscopy: Completed in 2013, due in 2023 Pap Smear: Completed in 2017 Mammogram: Completed in 2018 Hep C Screen: Negative  BP Readings from Last 3 Encounters:  08/07/18 122/84  04/26/18 124/76  04/25/17 128/78   She is not checking her BP at home. She denies chest pain, dizziness, shortness of breath.    Observations/Objective:  Alert and oriented. Appears well, not sickly. No distress. Speaking in complete sentences.   Assessment and Plan:  See problem based charting.  Follow Up Instructions:  Start taking calcium with vitamin D. You need 1200 mg of calcium and 800 units of vitamin D daily.   Start exercising. You should be getting 150 minutes of moderate intensity exercise weekly.  It's important to improve your diet by reducing consumption of fast food, fried food, processed snack foods, sugary  drinks. Increase consumption of fresh vegetables and fruits, whole grains, water.  Ensure you are drinking 64 ounces of water daily.  Continue to refrain from smoking.  Call the [facility] to schedule your mammogram.  We will complete your pap smear next year.  It was a pleasure to see you today! [name], NP-C    I discussed the assessment and treatment plan with the patient. The patient was provided an opportunity to ask questions and all were answered. The patient agreed with the plan and demonstrated an understanding of the instructions.   The patient was advised to call back or seek an in-person evaluation if the symptoms worsen or if the condition fails to improve as anticipated.     Pleas [name], NP    Review of Systems  Constitutional: Negative for unexpected weight change.  HENT: Negative for rhinorrhea.   Respiratory: Negative for cough and shortness of breath.   Cardiovascular: Negative for chest pain.  Gastrointestinal: Negative for constipation and diarrhea.  Genitourinary: Negative for difficulty urinating.  Musculoskeletal:       Intermittent arthralgias.   Skin: Negative for rash.  Allergic/Immunologic: Negative for environmental allergies.  Neurological: Negative for dizziness, numbness and headaches.  Psychiatric/Behavioral: The patient is not nervous/anxious.        Past Medical History:  Diagnosis Date  . Cervical dysplasia   . Heart murmur    no longer when she has younger  . Hypertension   . Mitral valve prolapse   . Rheumatic fever    63 years old  . Thyroid disease   .  Tobacco abuse   . Vertigo      Social History   Socioeconomic History  . Marital status: Married    Spouse name: Not on file  . Number of children: Not on file  . Years of education: Not on file  . Highest education level: Not on file  Occupational History  . Occupation: [employer]  Social Needs  . Financial resource strain: Not on file  .  Food insecurity:    Worry: Not on file    Inability: Not on file  . Transportation needs:    Medical: Not on file    Non-medical: Not on file  Tobacco Use  . Smoking status: Current Some Day Smoker    Packs/day: 1.50    Years: 44.00    Pack years: 66.00    Types: Cigars    Last attempt to quit: 04/20/2015    Years since quitting: 4.0  . Smokeless tobacco: Never Used  . Tobacco comment: 2 cigars a day  Substance and Sexual Activity  . Alcohol use: Yes    Alcohol/week: 0.0 standard drinks    Comment: occassional  . Drug use: No  . Sexual activity: Not on file  Lifestyle  . Physical activity:    Days per week: Not on file    Minutes per session: Not on file  . Stress: Not on file  Relationships  . Social connections:    Talks on phone: Not on file    Gets together: Not on file    Attends religious service: Not on file    Active member of club or organization: Not on file    Attends meetings of clubs or organizations: Not on file    Relationship status: Not on file  . Intimate partner violence:    Fear of current or ex partner: Not on file    Emotionally abused: Not on file    Physically abused: Not on file    Forced sexual activity: Not on file  Other Topics Concern  . Not on file  Social History Narrative   Married.   No children.   Moved from [place] last August for job.   Vacations in [place] every year.   Enjoys reading in her free time.    Past Surgical History:  Procedure Laterality Date  . CYSTECTOMY     right brow and buttocks  . TONSILLECTOMY  1972    Family History  Problem Relation Age of Onset  . Glaucoma Mother   . Hypothyroidism Mother   . Cancer Father        Lung CA  . Arthritis Maternal Grandmother   . Hypothyroidism Sister     No Known Allergies  Current Outpatient Medications on File Prior to Visit  Medication Sig Dispense Refill  . atorvastatin (LIPITOR) 20 MG tablet Take 1 tablet by mouth every evening for cholesterol. 90 tablet 3  .  buPROPion (WELLBUTRIN SR) 150 MG 12 hr tablet Take 1 tablet (150 mg total) by mouth 2 (two) times daily. 180 tablet 1  . levothyroxine (SYNTHROID, LEVOTHROID) 25 MCG tablet TAKE 1 TABLET BY MOUTH EVERY DAY BEFORE BREAKFAST 90 tablet 1  . meloxicam (MOBIC) 15 MG tablet TAKE 1 TABLET(15 MG) BY MOUTH DAILY AS NEEDED FOR PAIN (Patient not taking: Reported on 05/02/2019) 90 tablet 1   No current facility-administered medications on file prior to visit.     There were no vitals taken for this visit.   Objective:   Physical Exam  Constitutional:  She is oriented to person, place, and time. She appears well-nourished.  Eyes: Conjunctivae are normal.  Neck: Neck supple.  Respiratory: Effort normal.  Musculoskeletal: Normal range of motion.  Neurological: She is alert and oriented to person, place, and time.  Skin: Skin is dry.  Psychiatric: She has a normal mood and affect.           Assessment & Plan:

## 2019-05-02 NOTE — Assessment & Plan Note (Signed)
Recent A1C stable. Continue to monitor. Discussed to work on diet and exercise.

## 2019-05-02 NOTE — Assessment & Plan Note (Signed)
Taking levothyroxine in the morning with other medications, discussed correct instructions for administration. Recent TSH at goal. Continue levothyroxine 25 mcg.

## 2019-05-02 NOTE — Assessment & Plan Note (Signed)
No recent BP check, she will start checking on occasion and report readings at or above 135/90. Continue off of treatment.

## 2019-05-02 NOTE — Patient Instructions (Signed)
Start taking calcium with vitamin D. You need 1200 mg of calcium and 800 units of vitamin D daily.   Start exercising. You should be getting 150 minutes of moderate intensity exercise weekly.  It's important to improve your diet by reducing consumption of fast food, fried food, processed snack foods, sugary drinks. Increase consumption of fresh vegetables and fruits, whole grains, water.  Ensure you are drinking 64 ounces of water daily.  Continue to refrain from smoking.  Call the [facility] to schedule your mammogram.  We will complete your pap smear next year.  It was a pleasure to see you today! [name], NP-C

## 2019-05-02 NOTE — Assessment & Plan Note (Signed)
Recent lipid panel at goal on Lipitor, continue same.

## 2019-05-02 NOTE — Assessment & Plan Note (Signed)
Immunizations UTD, flu vaccination due this Fall. Pap smear due next year due to Covid-19. Mammogram due, orders placed. Encouraged regular exercise, healthy diet. Virtual exam unremarkable. Labs reviewed. Follow up in 1 year for CPE.

## 2019-05-07 ENCOUNTER — Other Ambulatory Visit: Payer: Self-pay | Admitting: Primary Care

## 2019-05-07 DIAGNOSIS — E039 Hypothyroidism, unspecified: Secondary | ICD-10-CM

## 2019-06-02 IMAGING — CT CT CHEST LUNG CANCER SCREENING LOW DOSE W/O CM
2 of 4 series · 15 of 40 positions shown, 18 images · non-contrast
Comparison: No priors.

CLINICAL DATA: 62-year-old female current smoker with 66 pack-year
history of smoking. Lung cancer screening examination.

EXAM:
CT CHEST WITHOUT CONTRAST LOW-DOSE FOR LUNG CANCER SCREENING
TECHNIQUE: Multidetector CT imaging of the chest was performed following the
standard protocol without IV contrast.

[Series 2: thorax 5.0 i31f 3 · axial · 0.71mm/px · z∈[+1177,+1452]mm · 12 of 66 slices shown, 15 images]
[im 6/66  mediastinal]
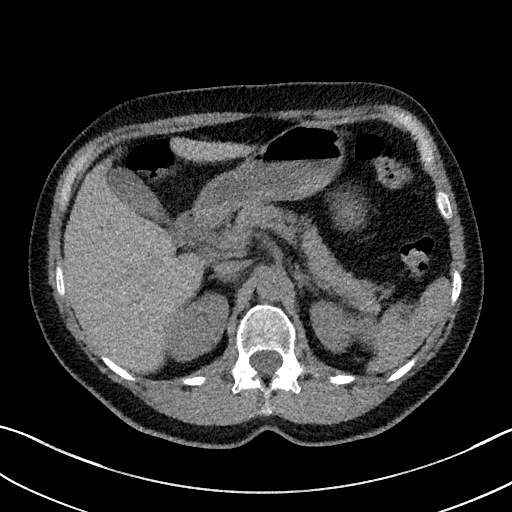
[im 6/66  lung]
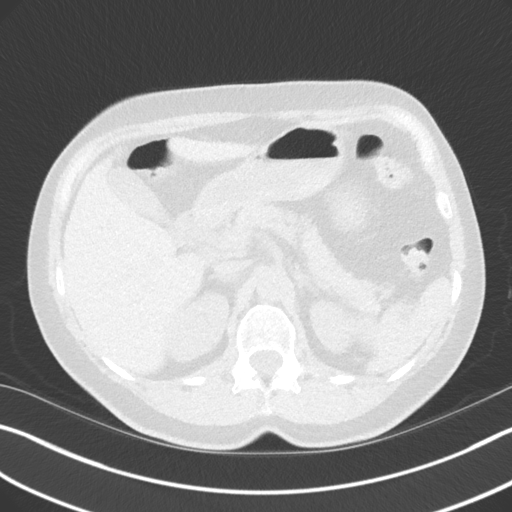
[im 11/66  lung]
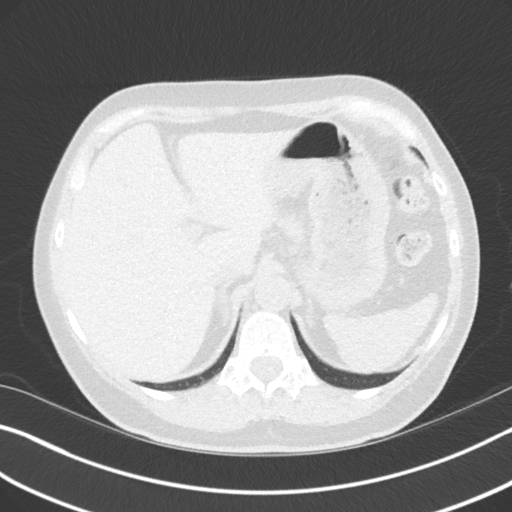
[im 16/66  lung]
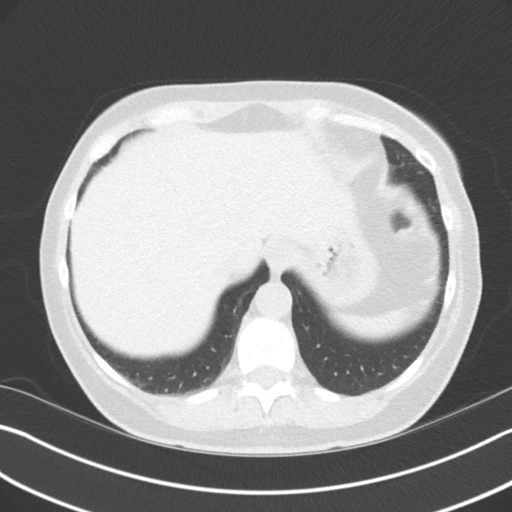
[im 21/66  lung]
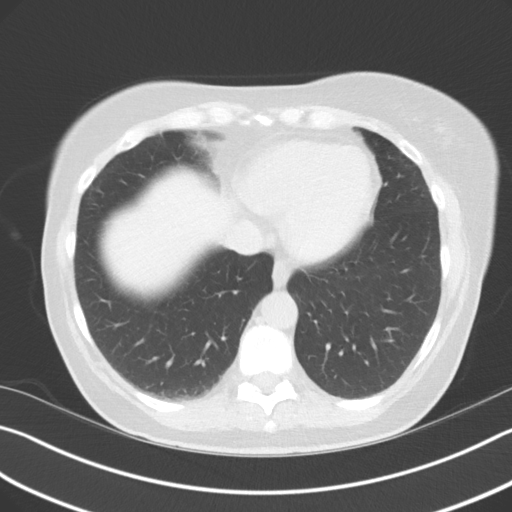
[im 26/66  mediastinal]
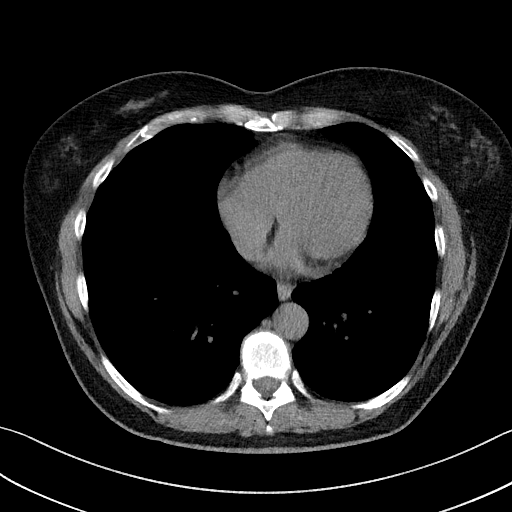
[im 26/66  lung]
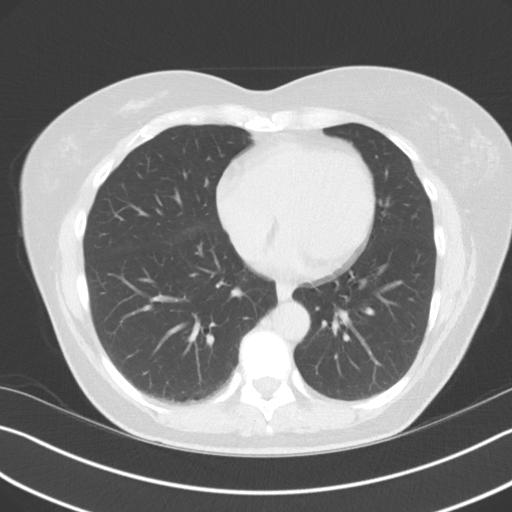
[im 31/66  lung]
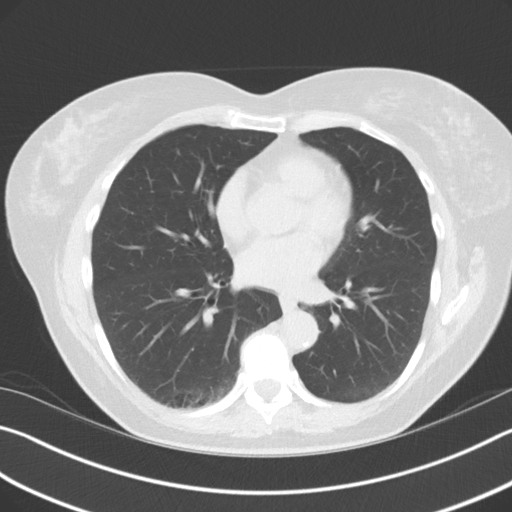
[im 36/66  lung]
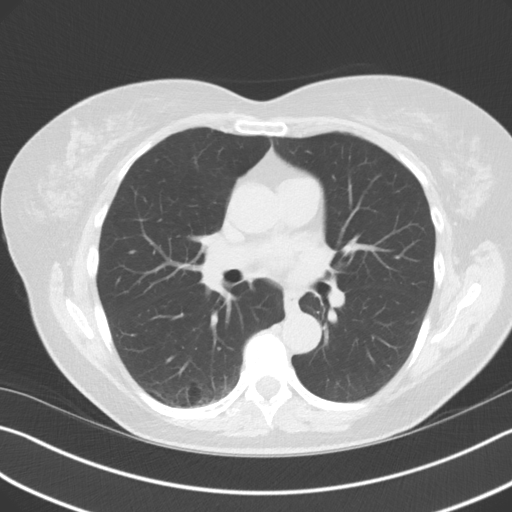
[im 41/66  lung]
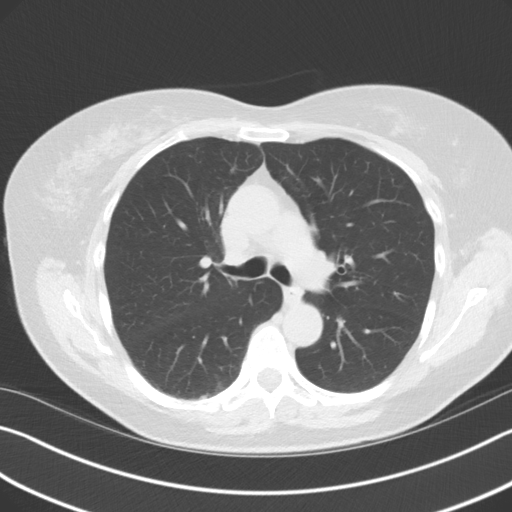
[im 46/66  mediastinal]
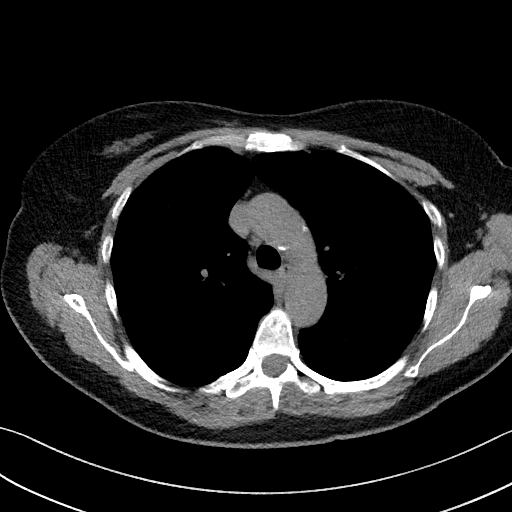
[im 46/66  lung]
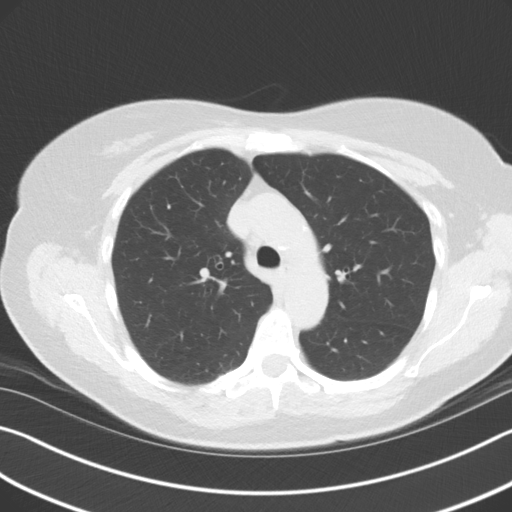
[im 51/66  lung]
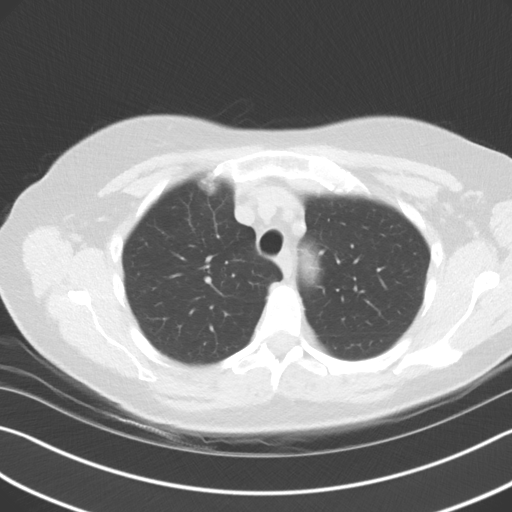
[im 56/66  lung]
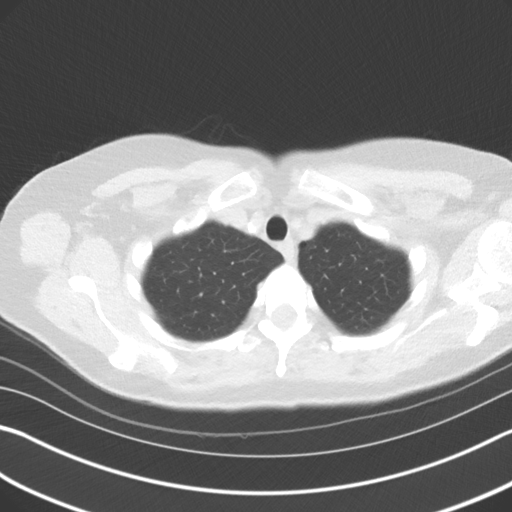
[im 61/66  lung]
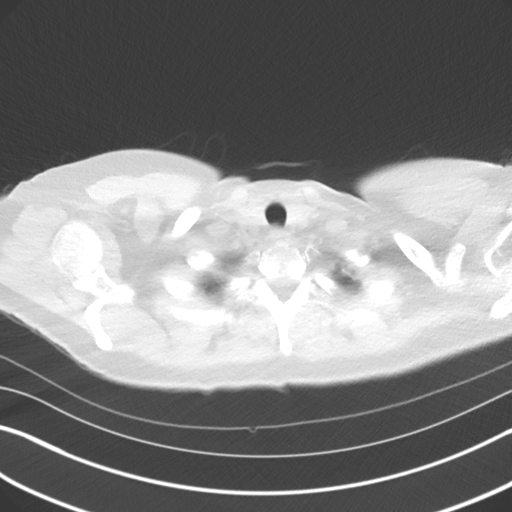

[Series 5: coronal · coronal · 0.64mm/px · 3 of 151 slices shown]
[im 31/151  lung]
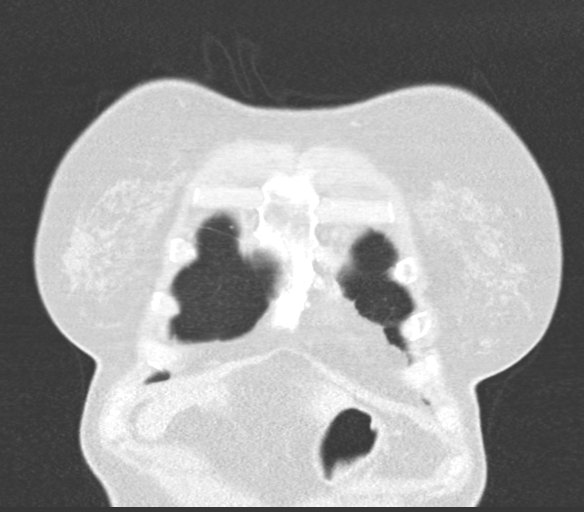
[im 61/151  lung]
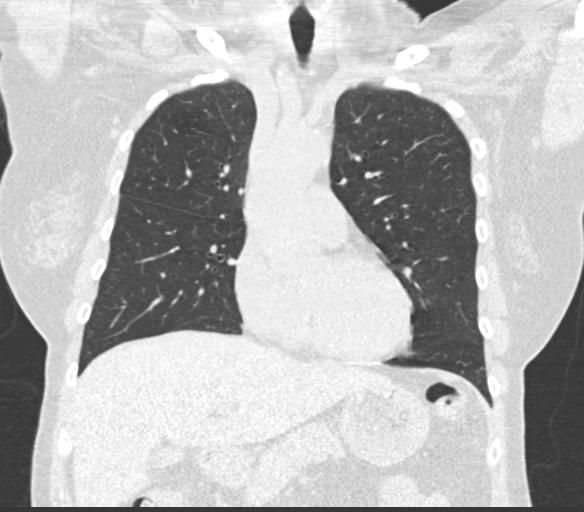
[im 91/151  lung]
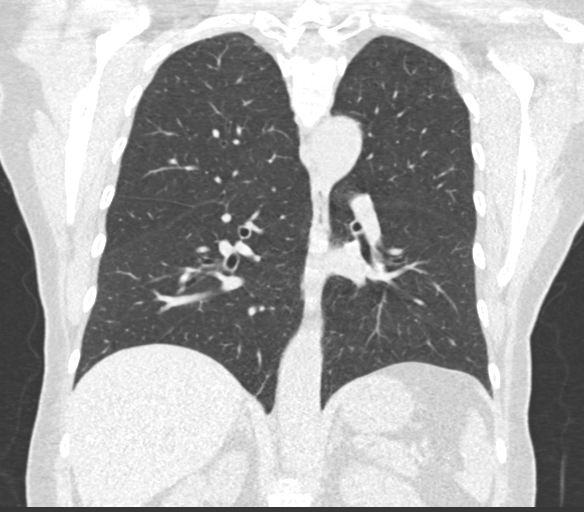

[15 of 40 positions shown; findings below may reference images not displayed]

FINDINGS: Cardiovascular: Heart size is normal. There is no significant
pericardial fluid, thickening or pericardial calcification. There is
aortic atherosclerosis, as well as atherosclerosis of the great
vessels of the mediastinum and the coronary arteries, including
calcified atherosclerotic plaque in the left main, left anterior
descending and left circumflex coronary arteries.

Mediastinum/Nodes: No pathologically enlarged mediastinal or hilar
lymph nodes. Please note that accurate exclusion of hilar adenopathy
is limited on noncontrast CT scans. Esophagus is unremarkable in
appearance. No axillary lymphadenopathy.

Lungs/Pleura: A few small pulmonary nodules are noted in both lungs,
largest of which is in the anterior aspect of the left upper lobe
(axial image 139 of series 3), with a volume derived mean diameter
of only 3.5 mm. No larger more suspicious appearing pulmonary
nodules or masses are noted. No acute consolidative airspace
disease. No pleural effusions. Mild diffuse bronchial wall
thickening with mild centrilobular and paraseptal emphysema.

Upper Abdomen: Aortic atherosclerosis.

Musculoskeletal: There are no aggressive appearing lytic or blastic
lesions noted in the visualized portions of the skeleton.
IMPRESSION: 1. Lung-RADS 2, benign appearance or behavior. Continue annual
screening with low-dose chest CT without contrast in 12 months.
2. The "S" modifier above refers to potentially clinically
significant non lung cancer related findings. Specifically, there is
aortic atherosclerosis, in addition to left main and 2 vessel
coronary artery disease. Please note that although the presence of
coronary artery calcium documents the presence of coronary artery
disease, the severity of this disease and any potential stenosis
cannot be assessed on this non-gated CT examination. Assessment for
potential risk factor modification, dietary therapy or pharmacologic
therapy may be warranted, if clinically indicated.
3. Mild diffuse bronchial wall thickening with mild centrilobular
and paraseptal emphysema; imaging findings suggestive of underlying
COPD.

Aortic Atherosclerosis (ZXDSN-BS8.8) and Emphysema (ZXDSN-P8D.U).

## 2019-06-14 ENCOUNTER — Other Ambulatory Visit: Payer: Self-pay | Admitting: *Deleted

## 2019-06-14 DIAGNOSIS — Z122 Encounter for screening for malignant neoplasm of respiratory organs: Secondary | ICD-10-CM

## 2019-06-14 DIAGNOSIS — F17210 Nicotine dependence, cigarettes, uncomplicated: Secondary | ICD-10-CM

## 2019-06-20 ENCOUNTER — Ambulatory Visit
Admission: RE | Admit: 2019-06-20 | Discharge: 2019-06-20 | Disposition: A | Discharge status: Home / Self Care | Payer: Managed Care, Other (non HMO) | Source: Ambulatory Visit | Attending: Primary Care | Admitting: Primary Care

## 2019-06-20 ENCOUNTER — Other Ambulatory Visit: Payer: Self-pay

## 2019-06-20 DIAGNOSIS — Z1239 Encounter for other screening for malignant neoplasm of breast: Secondary | ICD-10-CM

## 2019-06-24 ENCOUNTER — Other Ambulatory Visit: Payer: Self-pay | Admitting: Primary Care

## 2019-06-24 DIAGNOSIS — E785 Hyperlipidemia, unspecified: Secondary | ICD-10-CM

## 2019-07-19 ENCOUNTER — Ambulatory Visit (INDEPENDENT_AMBULATORY_CARE_PROVIDER_SITE_OTHER)
Admission: RE | Admit: 2019-07-19 | Discharge: 2019-07-19 | Disposition: A | Discharge status: Home / Self Care | Payer: Managed Care, Other (non HMO) | Source: Ambulatory Visit | Attending: Acute Care | Admitting: Acute Care

## 2019-07-19 ENCOUNTER — Other Ambulatory Visit: Payer: Self-pay

## 2019-07-19 DIAGNOSIS — F17210 Nicotine dependence, cigarettes, uncomplicated: Secondary | ICD-10-CM

## 2019-07-19 DIAGNOSIS — Z122 Encounter for screening for malignant neoplasm of respiratory organs: Secondary | ICD-10-CM

## 2019-07-26 ENCOUNTER — Other Ambulatory Visit: Payer: Self-pay | Admitting: *Deleted

## 2019-07-26 ENCOUNTER — Encounter: Payer: Self-pay | Admitting: *Deleted

## 2019-07-26 DIAGNOSIS — Z87891 Personal history of nicotine dependence: Secondary | ICD-10-CM

## 2019-07-26 DIAGNOSIS — Z122 Encounter for screening for malignant neoplasm of respiratory organs: Secondary | ICD-10-CM

## 2019-07-27 ENCOUNTER — Other Ambulatory Visit: Payer: Self-pay | Admitting: Primary Care

## 2019-07-27 DIAGNOSIS — Z720 Tobacco use: Secondary | ICD-10-CM

## 2019-08-29 ENCOUNTER — Ambulatory Visit (INDEPENDENT_AMBULATORY_CARE_PROVIDER_SITE_OTHER): Payer: Managed Care, Other (non HMO)

## 2019-08-29 DIAGNOSIS — Z23 Encounter for immunization: Secondary | ICD-10-CM | POA: Diagnosis not present

## 2019-08-29 IMAGING — DX DG SHOULDER 2+V*L*
3 series · 3 of 3 positions shown · non-contrast
Comparison: None

CLINICAL DATA: Acute LEFT shoulder pain with reduction of range of
motion for 2 months, no trauma

EXAM:
LEFT SHOULDER - 2+ VIEW

[shoulder axial]
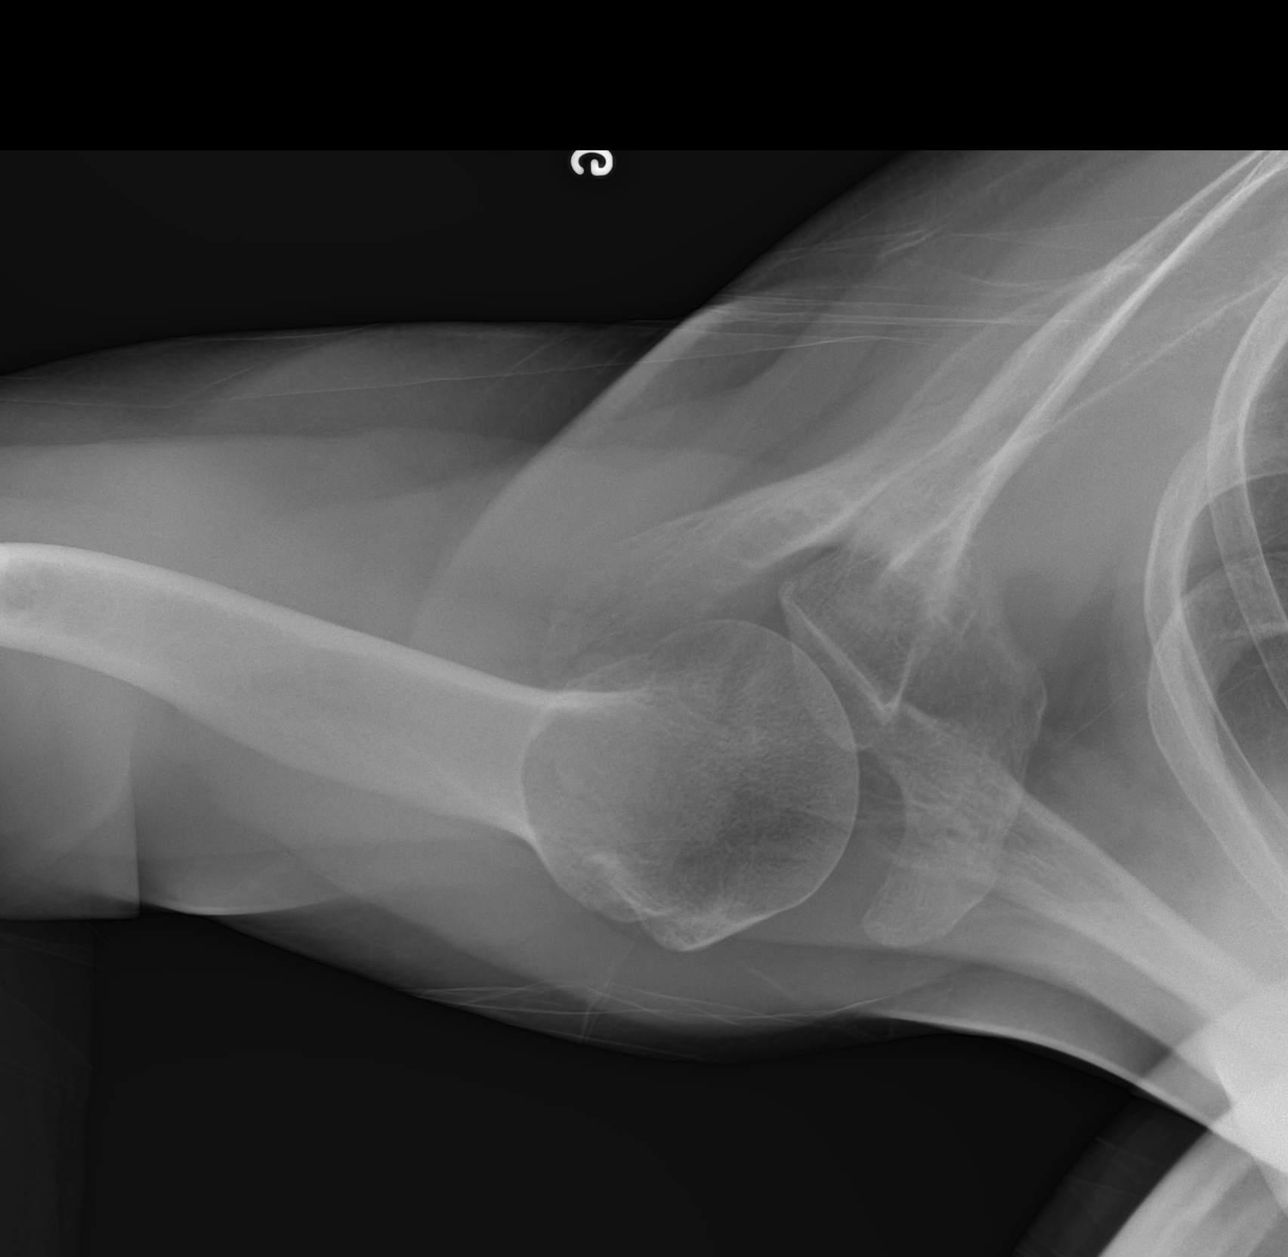

[shoulder ap]
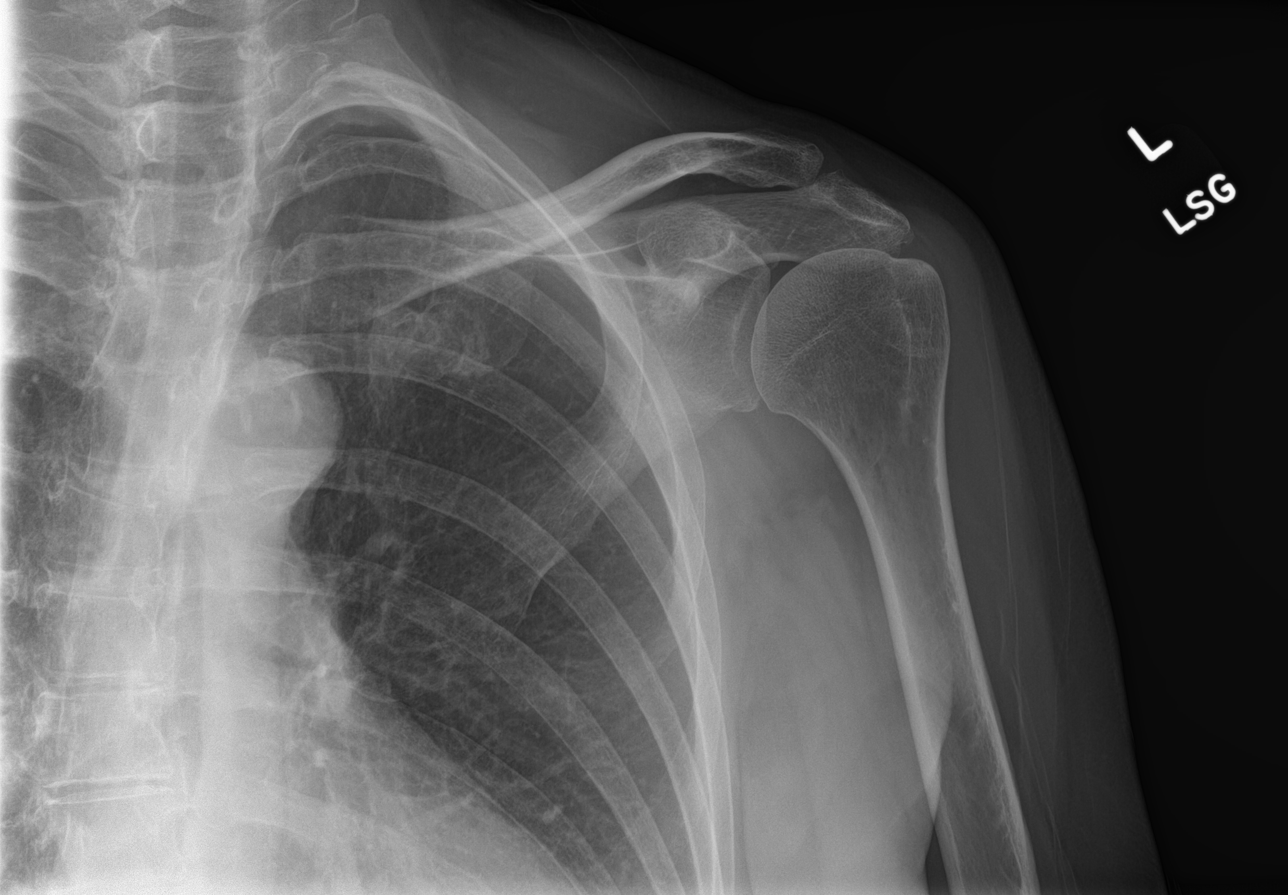

[shoulder y-view]
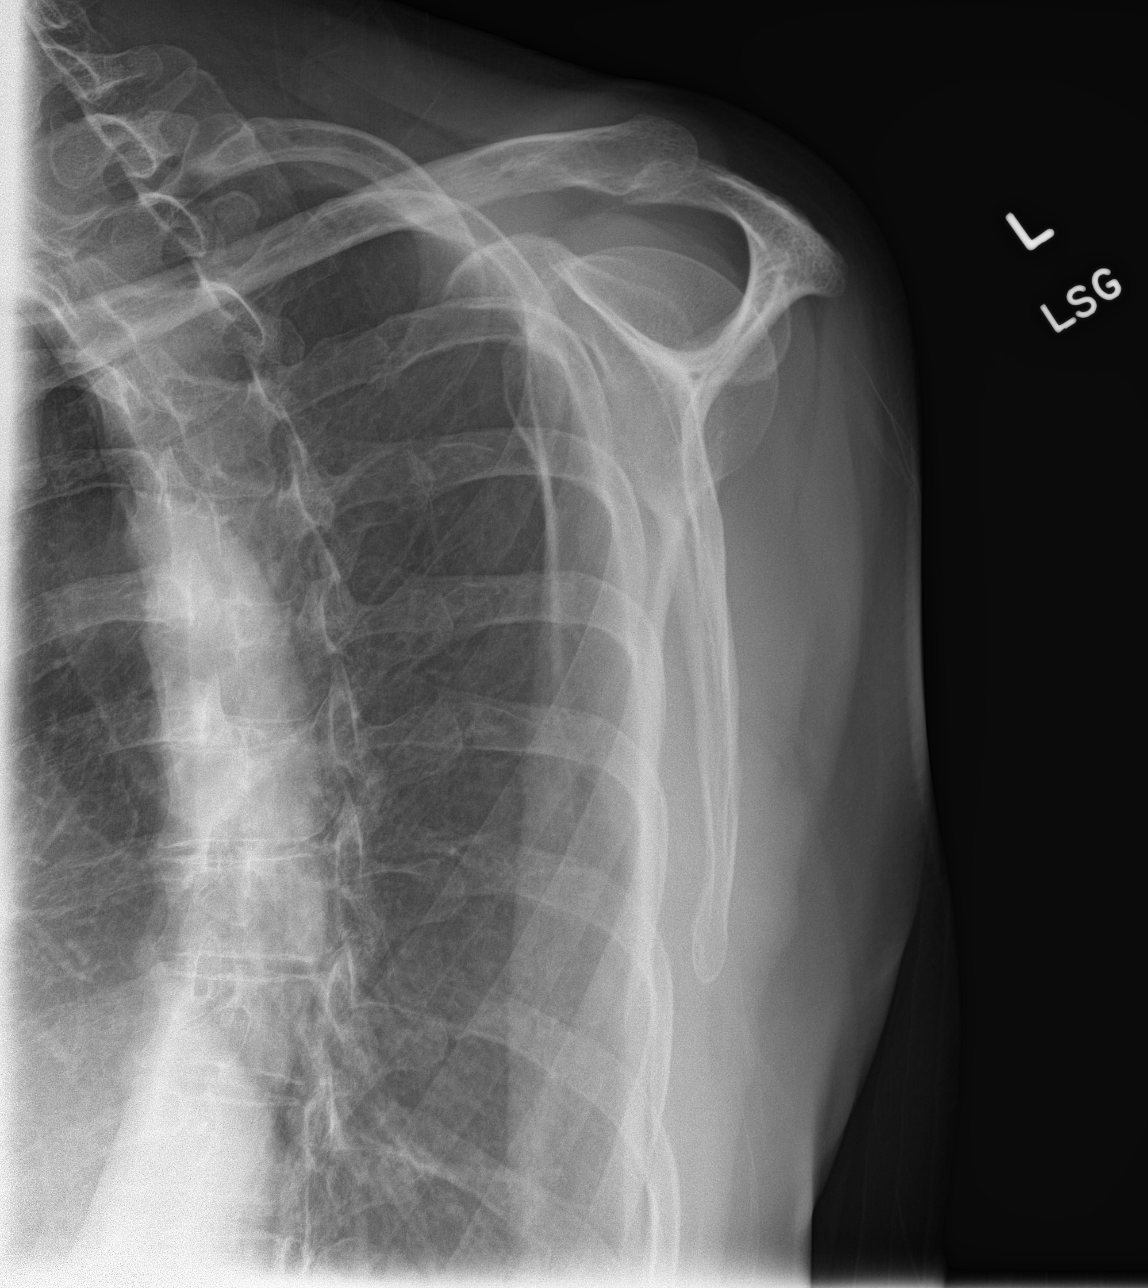

[3 of 3 positions shown; findings below may reference images not displayed]

FINDINGS: Osseous mineralization borderline decreased.

AC joint alignment normal.

No acute fracture, dislocation, or bone destruction.

Visualized LEFT ribs intact.
IMPRESSION: No acute abnormalities.

## 2019-11-01 ENCOUNTER — Other Ambulatory Visit: Payer: Self-pay | Admitting: Primary Care

## 2019-11-01 DIAGNOSIS — E039 Hypothyroidism, unspecified: Secondary | ICD-10-CM

## 2020-04-30 ENCOUNTER — Other Ambulatory Visit: Payer: Self-pay | Admitting: Primary Care

## 2020-04-30 DIAGNOSIS — E039 Hypothyroidism, unspecified: Secondary | ICD-10-CM

## 2020-05-07 ENCOUNTER — Other Ambulatory Visit: Payer: Self-pay

## 2020-05-07 ENCOUNTER — Encounter: Payer: Self-pay | Admitting: Primary Care

## 2020-05-07 ENCOUNTER — Other Ambulatory Visit (HOSPITAL_COMMUNITY)
Admission: RE | Admit: 2020-05-07 | Discharge: 2020-05-07 | Disposition: A | Discharge status: Home / Self Care | Payer: 59 | Source: Ambulatory Visit | Attending: Primary Care | Admitting: Primary Care

## 2020-05-07 ENCOUNTER — Ambulatory Visit (INDEPENDENT_AMBULATORY_CARE_PROVIDER_SITE_OTHER): Payer: 59 | Admitting: Primary Care

## 2020-05-07 VITALS — BP 142/78 | HR 80 | Temp 96.4°F | Ht 67.0 in | Wt 159.0 lb

## 2020-05-07 DIAGNOSIS — E039 Hypothyroidism, unspecified: Secondary | ICD-10-CM | POA: Diagnosis not present

## 2020-05-07 DIAGNOSIS — Z1231 Encounter for screening mammogram for malignant neoplasm of breast: Secondary | ICD-10-CM

## 2020-05-07 DIAGNOSIS — Z124 Encounter for screening for malignant neoplasm of cervix: Secondary | ICD-10-CM

## 2020-05-07 DIAGNOSIS — E785 Hyperlipidemia, unspecified: Secondary | ICD-10-CM | POA: Diagnosis not present

## 2020-05-07 DIAGNOSIS — Z0000 Encounter for general adult medical examination without abnormal findings: Secondary | ICD-10-CM

## 2020-05-07 DIAGNOSIS — Z23 Encounter for immunization: Secondary | ICD-10-CM | POA: Diagnosis not present

## 2020-05-07 DIAGNOSIS — M1991 Primary osteoarthritis, unspecified site: Secondary | ICD-10-CM

## 2020-05-07 DIAGNOSIS — I10 Essential (primary) hypertension: Secondary | ICD-10-CM | POA: Diagnosis not present

## 2020-05-07 DIAGNOSIS — R7303 Prediabetes: Secondary | ICD-10-CM

## 2020-05-07 DIAGNOSIS — Z720 Tobacco use: Secondary | ICD-10-CM

## 2020-05-07 DIAGNOSIS — H8110 Benign paroxysmal vertigo, unspecified ear: Secondary | ICD-10-CM

## 2020-05-07 LAB — COMPREHENSIVE METABOLIC PANEL
ALT: 14 U/L (ref 0–35)
AST: 16 U/L (ref 0–37)
Albumin: 4.1 g/dL (ref 3.5–5.2)
Alkaline Phosphatase: 74 U/L (ref 39–117)
BUN: 13 mg/dL (ref 6–23)
CO2: 29 mEq/L (ref 19–32)
Calcium: 9.3 mg/dL (ref 8.4–10.5)
Chloride: 108 mEq/L (ref 96–112)
Creatinine, Ser: 0.77 mg/dL (ref 0.40–1.20)
GFR: 75.41 mL/min (ref >60.00)
Glucose, Bld: 90 mg/dL (ref 70–99)
Potassium: 4.5 mEq/L (ref 3.5–5.1)
Sodium: 142 mEq/L (ref 135–145)
Total Bilirubin: 0.4 mg/dL (ref 0.2–1.2)
Total Protein: 6.5 g/dL (ref 6.0–8.3)

## 2020-05-07 LAB — LIPID PANEL
Cholesterol: 127 mg/dL (ref 0–200)
HDL: 57.5 mg/dL (ref >39.00)
LDL Cholesterol: 56 mg/dL (ref 0–99)
NonHDL: 69.99
Total CHOL/HDL Ratio: 2
Triglycerides: 70 mg/dL (ref 0.0–149.0)
VLDL: 14 mg/dL (ref 0.0–40.0)

## 2020-05-07 LAB — CBC
HCT: 40.3 % (ref 36.0–46.0)
Hemoglobin: 13.6 g/dL (ref 12.0–15.0)
MCHC: 33.7 g/dL (ref 30.0–36.0)
MCV: 90.6 fl (ref 78.0–100.0)
Platelets: 286 10*3/uL (ref 150.0–400.0)
RBC: 4.45 Mil/uL (ref 3.87–5.11)
RDW: 13.5 % (ref 11.5–15.5)
WBC: 5.2 10*3/uL (ref 4.0–10.5)

## 2020-05-07 LAB — TSH: TSH: 1.43 u[IU]/mL (ref 0.35–4.50)

## 2020-05-07 LAB — HEMOGLOBIN A1C: Hgb A1c MFr Bld: 6 % (ref 4.6–6.5)

## 2020-05-07 MED ORDER — BUPROPION HCL ER (SR) 150 MG PO TB12: ORAL_TABLET | ORAL | 3 refills | Status: DC

## 2020-05-07 NOTE — Assessment & Plan Note (Signed)
Commended her on daily walking, repeat A1C pending.

## 2020-05-07 NOTE — Assessment & Plan Note (Signed)
Taking levothyroxine incorrectly along with her Wellbutrin in the morning. Discussed to hold off on Wellbutrin for at least 30 minutes. She is taking vitamins HS.  Repeat TSH pending.

## 2020-05-07 NOTE — Progress Notes (Signed)
Subjective:    Patient ID: Gloria Neal, female    DOB: 12/01/1956, 64 y.o.   MRN: [id number]  HPI  This visit occurred during the SARS-CoV-2 public health emergency.  Safety protocols were in place, including screening questions prior to the visit, additional usage of staff PPE, and extensive cleaning of exam room while observing appropriate contact time as indicated for disinfecting solutions.   Gloria Neal is a 64 year old female who presents today for complete physical.  She is checking her BP at home over the last several days which is running 107/70, 120/80. She has had some dizziness, but her dog jerked her on the leash, describes it as a spinning sensation that is like her vertigo.   Immunizations: -Tetanus: Completed in 2018 -Influenza: Completed last season -Shingles: Completed Zostavax in 2017 -Pneumonia:  Completed Pneumovax in 2016 -Covid-19: Completed series  Diet: She endorses a healthy diet. Exercise: She walks once daily for about one hour.   Eye exam: Due in July  Dental exam: Completes frequently  Pap Smear: Completed in 2017, due today Mammogram: Completed in July 2020, 2023 Colonoscopy: Completed in 2013 Hep C Screen: Negative  Lung Cancer Screening: Due in August   BP Readings from Last 3 Encounters:  05/07/20 (!) 142/78  08/07/18 122/84  04/26/18 124/76     Review of Systems  Constitutional: Negative for unexpected weight change.  HENT: Positive for rhinorrhea and sneezing.   Respiratory: Negative for cough and shortness of breath.   Cardiovascular: Negative for chest pain.  Gastrointestinal: Negative for constipation and diarrhea.  Genitourinary: Negative for difficulty urinating and menstrual problem.  Musculoskeletal: Negative for arthralgias and myalgias.  Skin: Negative for rash.  Allergic/Immunologic: Positive for environmental allergies.  Neurological: Negative for numbness and headaches.  Psychiatric/Behavioral: The patient is not  nervous/anxious.        Past Medical History:  Diagnosis Date   Cervical dysplasia    Heart murmur    no longer when she has younger   Hypertension    Mitral valve prolapse    Rheumatic fever    64 years old   Thyroid disease    Tobacco abuse    Vertigo      Social History   Socioeconomic History   Marital status: Married    Spouse name: Not on file   Number of children: Not on file   Years of education: Not on file   Highest education level: Not on file  Occupational History   Occupation: [employer]  Tobacco Use   Smoking status: Former Smoker    Packs/day: 1.50    Years: 44.00    Pack years: 66.00    Types: Cigars    Quit date: 03/06/2019    Years since quitting: 1.1   Smokeless tobacco: Never Used   Tobacco comment: 2 cigars a day  Substance and Sexual Activity   Alcohol use: Yes    Alcohol/week: 0.0 standard drinks    Comment: occassional   Drug use: No   Sexual activity: Not on file  Other Topics Concern   Not on file  Social History Narrative   Married.   No children.   Moved from [place] last August for job.   Vacations in [place] every year.   Enjoys reading in her free time.   Social Determinants of Health   Financial Resource Strain:    Difficulty of Paying Living Expenses:   Food Insecurity:    Worried About Charity fundraiser in  the Last Year:    Arboriculturist in the Last Year:   Transportation Needs:    Film/video editor (Medical):    Lack of Transportation (Non-Medical):   Physical Activity:    Days of Exercise per Week:    Minutes of Exercise per Session:   Stress:    Feeling of Stress :   Social Connections:    Frequency of Communication with Friends and Family:    Frequency of Social Gatherings with Friends and Family:    Attends Religious Services:    Active Member of Clubs or Organizations:    Attends Music therapist:    Marital Status:   Intimate Partner Violence:    Fear  of Current or Ex-Partner:    Emotionally Abused:    Physically Abused:    Sexually Abused:     Past Surgical History:  Procedure Laterality Date   CYSTECTOMY     right brow and buttocks   TONSILLECTOMY  1972    Family History  Problem Relation Age of Onset   Glaucoma Mother    Hypothyroidism Mother    Cancer Father        Lung CA   Arthritis Maternal Grandmother    Hypothyroidism Sister     No Known Allergies  Current Outpatient Medications on File Prior to Visit  Medication Sig Dispense Refill   atorvastatin (LIPITOR) 20 MG tablet TAKE 1 TABLET BY MOUTH EVERY EVENING FOR CHOLESTEROL 90 tablet 3   levothyroxine (SYNTHROID) 25 MCG tablet TAKE 1 TABLET BY MOUTH EVERY DAY BEFORE BREAKFAST 90 tablet 1   meloxicam (MOBIC) 15 MG tablet TAKE 1 TABLET(15 MG) BY MOUTH DAILY AS NEEDED FOR PAIN (Patient not taking: Reported on 05/07/2020) 90 tablet 1   No current facility-administered medications on file prior to visit.    BP (!) 142/78    Pulse 80    Temp (!) 96.4 F (35.8 C) (Temporal)    Ht 5\' 7"  (1.702 m)    Wt 159 lb (72.1 kg)    SpO2 98%    BMI 24.90 kg/m    Objective:   Physical Exam  Constitutional: She is oriented to person, place, and time. She appears well-nourished.  HENT:  Right Ear: Tympanic membrane and ear canal normal.  Left Ear: Tympanic membrane and ear canal normal.  Mouth/Throat: Oropharynx is clear and moist.  Eyes: Pupils are equal, round, and reactive to light. EOM are normal.  Cardiovascular: Normal rate and regular rhythm.  Respiratory: Effort normal and breath sounds normal.  GI: Soft. Bowel sounds are normal. There is no abdominal tenderness.  Genitourinary: There is no tenderness or lesion on the right labia. There is no tenderness or lesion on the left labia. Cervix exhibits no motion tenderness and no discharge. Right adnexum displays no tenderness. Left adnexum displays no tenderness.    No vaginal discharge or erythema.  No erythema  in the vagina.  Musculoskeletal:        General: Normal range of motion.     Cervical back: Neck supple.     Left foot: No tenderness or bony tenderness.       Feet:     Comments: Soft, immobile, non tender mass to left plantar foot. Appears cyst like.   Neurological: She is alert and oriented to person, place, and time. No cranial nerve deficit.  Reflex Scores:      Patellar reflexes are 2+ on the right side and 2+ on the left side. Skin:  Skin is warm and dry.  Psychiatric: She has a normal mood and affect.           Assessment & Plan:

## 2020-05-07 NOTE — Assessment & Plan Note (Addendum)
Stable, no concerns today. Not taking Meloxicam.  She does have a small cyst like appearing mass to the left mid plantar foot. Chronic for years, unchanged for years.   I offered xray/ultrasound, podiatry consultation. She declines today but will update with any changes.

## 2020-05-07 NOTE — Patient Instructions (Signed)
Stop by the lab prior to leaving today. I will notify you of your results once received.   Continue  exercising. You should be getting 150 minutes of moderate intensity exercise weekly.  Continue to work on a healthy diet. Ensure you are consuming 64 ounces of water daily.  Schedule a nurse visit for your second Shingrix in 2-6 months.  Call the breast center to schedule your mammogram.  It was a pleasure to see you today!   Preventive Care 40-64 Years Old, Female Preventive care refers to visits with your health care provider and lifestyle choices that can promote health and wellness. This includes:  A yearly physical exam. This may also be called an annual well check.  Regular dental visits and eye exams.  Immunizations.  Screening for certain conditions.  Healthy lifestyle choices, such as eating a healthy diet, getting regular exercise, not using drugs or products that contain nicotine and tobacco, and limiting alcohol use. What can I expect for my preventive care visit? Physical exam Your health care provider will check your:  Height and weight. This may be used to calculate body mass index (BMI), which tells if you are at a healthy weight.  Heart rate and blood pressure.  Skin for abnormal spots. Counseling Your health care provider may ask you questions about your:  Alcohol, tobacco, and drug use.  Emotional well-being.  Home and relationship well-being.  Sexual activity.  Eating habits.  Work and work Statistician.  Method of birth control.  Menstrual cycle.  Pregnancy history. What immunizations do I need?  Influenza (flu) vaccine  This is recommended every year. Tetanus, diphtheria, and pertussis (Tdap) vaccine  You may need a Td booster every 10 years. Varicella (chickenpox) vaccine  You may need this if you have not been vaccinated. Zoster (shingles) vaccine  You may need this after age 31. Measles, mumps, and rubella (MMR) vaccine  You  may need at least one dose of MMR if you were born in 05/11/1956 or later. You may also need a second dose. Pneumococcal conjugate (PCV13) vaccine  You may need this if you have certain conditions and were not previously vaccinated. Pneumococcal polysaccharide (PPSV23) vaccine  You may need one or two doses if you smoke cigarettes or if you have certain conditions. Meningococcal conjugate (MenACWY) vaccine  You may need this if you have certain conditions. Hepatitis A vaccine  You may need this if you have certain conditions or if you travel or work in places where you may be exposed to hepatitis A. Hepatitis B vaccine  You may need this if you have certain conditions or if you travel or work in places where you may be exposed to hepatitis B. Haemophilus influenzae type b (Hib) vaccine  You may need this if you have certain conditions. Human papillomavirus (HPV) vaccine  If recommended by your health care provider, you may need three doses over 6 months. You may receive vaccines as individual doses or as more than one vaccine together in one shot (combination vaccines). Talk with your health care provider about the risks and benefits of combination vaccines. What tests do I need? Blood tests  Lipid and cholesterol levels. These may be checked every 5 years, or more frequently if you are over 16 years old.  Hepatitis C test.  Hepatitis B test. Screening  Lung cancer screening. You may have this screening every year starting at age 99 if you have a 30-pack-year history of smoking and currently smoke or have quit  within the past 15 years.  Colorectal cancer screening. All adults should have this screening starting at age 64 and continuing until age 29. Your health care provider may recommend screening at age 36 if you are at increased risk. You will have tests every 1-10 years, depending on your results and the type of screening test.  Diabetes screening. This is done by checking your  blood sugar (glucose) after you have not eaten for a while (fasting). You may have this done every 1-3 years.  Mammogram. This may be done every 1-2 years. Talk with your health care provider about when you should start having regular mammograms. This may depend on whether you have a family history of breast cancer.  BRCA-related cancer screening. This may be done if you have a family history of breast, ovarian, tubal, or peritoneal cancers.  Pelvic exam and Pap test. This may be done every 3 years starting at age 74. Starting at age 90, this may be done every 5 years if you have a Pap test in combination with an HPV test. Other tests  Sexually transmitted disease (STD) testing.  Bone density scan. This is done to screen for osteoporosis. You may have this scan if you are at high risk for osteoporosis. Follow these instructions at home: Eating and drinking  Eat a diet that includes fresh fruits and vegetables, whole grains, lean protein, and low-fat dairy.  Take vitamin and mineral supplements as recommended by your health care provider.  Do not drink alcohol if: ? Your health care provider tells you not to drink. ? You are pregnant, may be pregnant, or are planning to become pregnant.  If you drink alcohol: ? Limit how much you have to 0-1 drink a day. ? Be aware of how much alcohol is in your drink. In the [place] one drink equals one 12 oz bottle of beer (355 mL), one 5 oz glass of wine (148 mL), or one 1 oz glass of hard liquor (44 mL). Lifestyle  Take daily care of your teeth and gums.  Stay active. Exercise for at least 30 minutes on 5 or more days each week.  Do not use any products that contain nicotine or tobacco, such as cigarettes, e-cigarettes, and chewing tobacco. If you need help quitting, ask your health care provider.  If you are sexually active, practice safe sex. Use a condom or other form of birth control (contraception) in order to prevent pregnancy and STIs  (sexually transmitted infections).  If told by your health care provider, take low-dose aspirin daily starting at age 62. What's next?  Visit your health care provider once a year for a well check visit.  Ask your health care provider how often you should have your eyes and teeth checked.  Stay up to date on all vaccines. This information is not intended to replace advice given to you by your health care provider. Make sure you discuss any questions you have with your health care provider. Document Revised: 08/02/2018 Document Reviewed: 08/02/2018 Elsevier Patient Education  2020 [employer].

## 2020-05-07 NOTE — Assessment & Plan Note (Signed)
Shingrix due, provided first dose today. Other immunizations UTD.  Pap smear due, pending. Mammogram due, orders placed. Colonoscopy UTD, due in 2023. Encouraged regular exercise, healthy diet. Exam today stable. Labs pending.

## 2020-05-07 NOTE — Assessment & Plan Note (Signed)
Not smoking. Doing well on Wellbutrin and is requesting a refill. Refill provided.

## 2020-05-07 NOTE — Assessment & Plan Note (Signed)
Intermittent, recent spell last weekend, improving. Neuro exam today negative.

## 2020-05-07 NOTE — Assessment & Plan Note (Signed)
Above goal today, home readings are within normal range. Will have her continue to monitor readings and report readings at or above 130/90.  CMP pending.

## 2020-05-07 NOTE — Addendum Note (Signed)
Addended by: Jacqualin Combes on: 05/07/2020 10:46 AM   Modules accepted: Orders

## 2020-05-07 NOTE — Assessment & Plan Note (Signed)
Compliant to atorvastatin, repeat lipids pending. 

## 2020-05-11 LAB — CYTOLOGY - PAP
Comment: NEGATIVE
Diagnosis: NEGATIVE
High risk HPV: NEGATIVE

## 2020-06-18 ENCOUNTER — Other Ambulatory Visit: Payer: Self-pay | Admitting: Primary Care

## 2020-06-18 DIAGNOSIS — E785 Hyperlipidemia, unspecified: Secondary | ICD-10-CM

## 2020-06-22 ENCOUNTER — Ambulatory Visit
Admission: RE | Admit: 2020-06-22 | Discharge: 2020-06-22 | Disposition: A | Discharge status: Home / Self Care | Payer: 59 | Source: Ambulatory Visit | Attending: Primary Care | Admitting: Primary Care

## 2020-06-22 ENCOUNTER — Other Ambulatory Visit: Payer: Self-pay

## 2020-06-22 DIAGNOSIS — Z1231 Encounter for screening mammogram for malignant neoplasm of breast: Secondary | ICD-10-CM

## 2020-07-29 ENCOUNTER — Ambulatory Visit
Admission: RE | Admit: 2020-07-29 | Discharge: 2020-07-29 | Disposition: A | Discharge status: Home / Self Care | Payer: 59 | Source: Ambulatory Visit | Attending: Acute Care | Admitting: Acute Care

## 2020-07-29 ENCOUNTER — Other Ambulatory Visit: Payer: Self-pay

## 2020-07-29 DIAGNOSIS — Z122 Encounter for screening for malignant neoplasm of respiratory organs: Secondary | ICD-10-CM | POA: Diagnosis present

## 2020-07-29 DIAGNOSIS — Z87891 Personal history of nicotine dependence: Secondary | ICD-10-CM | POA: Diagnosis present

## 2020-07-30 NOTE — Progress Notes (Signed)
Please call patient and let them  know their  low dose Ct was read as a Lung RADS 2: nodules that are benign in appearance and behavior with a very low likelihood of becoming a clinically active cancer due to size or lack of growth. Recommendation per radiology is for a repeat LDCT in 12 months. .Please let them  know we will order and schedule their  annual screening scan for 07/2021. Please let them  know there was notation of CAD on their  scan.  Please remind the patient  that this is a non-gated exam therefore degree or severity of disease  cannot be determined. Please have them  follow up with their PCP regarding potential risk factor modification, dietary therapy or pharmacologic therapy if clinically indicated. Pt.  is  currently on statin therapy. Please place order for annual  screening scan for  07/2021 and fax results to PCP. Thanks so much. 

## 2020-08-05 ENCOUNTER — Other Ambulatory Visit: Payer: Self-pay | Admitting: *Deleted

## 2020-08-05 DIAGNOSIS — Z87891 Personal history of nicotine dependence: Secondary | ICD-10-CM

## 2020-09-07 NOTE — Telephone Encounter (Signed)
Pt left v/m requesting same info as the pt message that Gentry Fitz NP has already answered. I spoke with pt and advised as instructed and added the flu shot to the nurse visit per Gentry Fitz NP. Nothing further needed at this time.

## 2020-09-10 ENCOUNTER — Ambulatory Visit (INDEPENDENT_AMBULATORY_CARE_PROVIDER_SITE_OTHER): Payer: 59

## 2020-09-10 ENCOUNTER — Other Ambulatory Visit: Payer: Self-pay

## 2020-09-10 DIAGNOSIS — Z23 Encounter for immunization: Secondary | ICD-10-CM

## 2020-11-05 ENCOUNTER — Other Ambulatory Visit: Payer: Self-pay | Admitting: Primary Care

## 2020-11-05 DIAGNOSIS — E039 Hypothyroidism, unspecified: Secondary | ICD-10-CM

## 2020-11-06 NOTE — Telephone Encounter (Signed)
Pharmacy requests refill on: Levothyroxine 25 mcg   LAST REFILL: 04/30/2020 (Q-90, R-1)  LAST OV: 05/07/2020 NEXT OV: Not Scheduled  PHARMACY: [place], [place]  TSH (05/07/2020): 1.43

## 2021-05-04 ENCOUNTER — Other Ambulatory Visit: Payer: Self-pay | Admitting: Primary Care

## 2021-05-04 DIAGNOSIS — E039 Hypothyroidism, unspecified: Secondary | ICD-10-CM

## 2021-06-03 ENCOUNTER — Ambulatory Visit (INDEPENDENT_AMBULATORY_CARE_PROVIDER_SITE_OTHER): Payer: Medicare Other | Admitting: Primary Care

## 2021-06-03 ENCOUNTER — Other Ambulatory Visit: Payer: Self-pay

## 2021-06-03 ENCOUNTER — Other Ambulatory Visit: Payer: Self-pay | Admitting: Primary Care

## 2021-06-03 ENCOUNTER — Encounter: Payer: Self-pay | Admitting: Primary Care

## 2021-06-03 VITALS — BP 136/74 | HR 72 | Temp 96.4°F | Ht 67.0 in | Wt 161.0 lb

## 2021-06-03 DIAGNOSIS — H8110 Benign paroxysmal vertigo, unspecified ear: Secondary | ICD-10-CM

## 2021-06-03 DIAGNOSIS — G479 Sleep disorder, unspecified: Secondary | ICD-10-CM

## 2021-06-03 DIAGNOSIS — E785 Hyperlipidemia, unspecified: Secondary | ICD-10-CM | POA: Diagnosis not present

## 2021-06-03 DIAGNOSIS — Z0000 Encounter for general adult medical examination without abnormal findings: Secondary | ICD-10-CM | POA: Diagnosis not present

## 2021-06-03 DIAGNOSIS — R7303 Prediabetes: Secondary | ICD-10-CM | POA: Diagnosis not present

## 2021-06-03 DIAGNOSIS — E039 Hypothyroidism, unspecified: Secondary | ICD-10-CM | POA: Diagnosis not present

## 2021-06-03 DIAGNOSIS — Z720 Tobacco use: Secondary | ICD-10-CM | POA: Diagnosis not present

## 2021-06-03 DIAGNOSIS — E2839 Other primary ovarian failure: Secondary | ICD-10-CM

## 2021-06-03 DIAGNOSIS — M1991 Primary osteoarthritis, unspecified site: Secondary | ICD-10-CM

## 2021-06-03 DIAGNOSIS — I10 Essential (primary) hypertension: Secondary | ICD-10-CM | POA: Diagnosis not present

## 2021-06-03 DIAGNOSIS — Z1231 Encounter for screening mammogram for malignant neoplasm of breast: Secondary | ICD-10-CM

## 2021-06-03 LAB — CBC
HCT: 40.2 % (ref 36.0–46.0)
Hemoglobin: 13.6 g/dL (ref 12.0–15.0)
MCHC: 33.8 g/dL (ref 30.0–36.0)
MCV: 87.3 fl (ref 78.0–100.0)
Platelets: 339 10*3/uL (ref 150.0–400.0)
RBC: 4.6 Mil/uL (ref 3.87–5.11)
RDW: 13.8 % (ref 11.5–15.5)
WBC: 6.5 10*3/uL (ref 4.0–10.5)

## 2021-06-03 LAB — HEMOGLOBIN A1C: Hgb A1c MFr Bld: 6 % (ref 4.6–6.5)

## 2021-06-03 LAB — COMPREHENSIVE METABOLIC PANEL
ALT: 19 U/L (ref 0–35)
AST: 19 U/L (ref 0–37)
Albumin: 4.2 g/dL (ref 3.5–5.2)
Alkaline Phosphatase: 95 U/L (ref 39–117)
BUN: 11 mg/dL (ref 6–23)
CO2: 27 mEq/L (ref 19–32)
Calcium: 9.5 mg/dL (ref 8.4–10.5)
Chloride: 106 mEq/L (ref 96–112)
Creatinine, Ser: 0.92 mg/dL (ref 0.40–1.20)
GFR: 65.43 mL/min (ref >60.00)
Glucose, Bld: 86 mg/dL (ref 70–99)
Potassium: 4.6 mEq/L (ref 3.5–5.1)
Sodium: 140 mEq/L (ref 135–145)
Total Bilirubin: 0.7 mg/dL (ref 0.2–1.2)
Total Protein: 6.6 g/dL (ref 6.0–8.3)

## 2021-06-03 LAB — LIPID PANEL
Cholesterol: 150 mg/dL (ref 0–200)
HDL: 62.7 mg/dL (ref >39.00)
LDL Cholesterol: 67 mg/dL (ref 0–99)
NonHDL: 87.27
Total CHOL/HDL Ratio: 2
Triglycerides: 100 mg/dL (ref 0.0–149.0)
VLDL: 20 mg/dL (ref 0.0–40.0)

## 2021-06-03 LAB — TSH: TSH: 1.47 u[IU]/mL (ref 0.35–5.50)

## 2021-06-03 MED ORDER — BUPROPION HCL ER (SR) 150 MG PO TB12: 150.0000 mg | ORAL_TABLET | Freq: Every day | ORAL | 3 refills | Status: DC

## 2021-06-03 MED ORDER — LEVOTHYROXINE SODIUM 25 MCG PO TABS: ORAL_TABLET | ORAL | 3 refills | Status: DC

## 2021-06-03 NOTE — Assessment & Plan Note (Signed)
Borderline high today, she does have better readings at home. Continue to monitor off medications.

## 2021-06-03 NOTE — Assessment & Plan Note (Signed)
Immunizations UTD. Mammogram due, orders placed. Dexa scan due, orders placed. Colonoscopy due in 2023.  Discussed the importance of a healthy diet and regular exercise in order for weight loss, and to reduce the risk of further co-morbidity.  Exam today stable. Labs pending.

## 2021-06-03 NOTE — Patient Instructions (Signed)
Stop by the lab prior to leaving today. I will notify you of your results once received.   Call the Breast Center to schedule your mammogram and bone density scan.   Try the Melatonin as discussed.  It was a pleasure to see you today!  Preventive Care 65 Years and Older, Female Preventive care refers to lifestyle choices and visits with your health care provider that can promote health and wellness. This includes: A yearly physical exam. This is also called an annual wellness visit. Regular dental and eye exams. Immunizations. Screening for certain conditions. Healthy lifestyle choices, such as: Eating a healthy diet. Getting regular exercise. Not using drugs or products that contain nicotine and tobacco. Limiting alcohol use. What can I expect for my preventive care visit? Physical exam Your health care provider will check your: Height and weight. These may be used to calculate your BMI (body mass index). BMI is a measurement that tells if you are at a healthy weight. Heart rate and blood pressure. Body temperature. Skin for abnormal spots. Counseling Your health care provider may ask you questions about your: Past medical problems. Family's medical history. Alcohol, tobacco, and drug use. Emotional well-being. Home life and relationship well-being. Sexual activity. Diet, exercise, and sleep habits. History of falls. Memory and ability to understand (cognition). Work and work Statistician. Pregnancy and menstrual history. Access to firearms. What immunizations do I need?  Vaccines are usually given at various ages, according to a schedule. Your health care provider will recommend vaccines for you based on your age, medicalhistory, and lifestyle or other factors, such as travel or where you work. What tests do I need? Blood tests Lipid and cholesterol levels. These may be checked every 5 years, or more often depending on your overall health. Hepatitis C test. Hepatitis B  test. Screening Lung cancer screening. You may have this screening every year starting at age 81 if you have a 30-pack-year history of smoking and currently smoke or have quit within the past 15 years. Colorectal cancer screening. All adults should have this screening starting at age 20 and continuing until age 32. Your health care provider may recommend screening at age 30 if you are at increased risk. You will have tests every 1-10 years, depending on your results and the type of screening test. Diabetes screening. This is done by checking your blood sugar (glucose) after you have not eaten for a while (fasting). You may have this done every 1-3 years. Mammogram. This may be done every 1-2 years. Talk with your health care provider about how often you should have regular mammograms. Abdominal aortic aneurysm (AAA) screening. You may need this if you are a current or former smoker. BRCA-related cancer screening. This may be done if you have a family history of breast, ovarian, tubal, or peritoneal cancers. Other tests STD (sexually transmitted disease) testing, if you are at risk. Bone density scan. This is done to screen for osteoporosis. You may have this done starting at age 52. Talk with your health care provider about your test results, treatment options,and if necessary, the need for more tests. Follow these instructions at home: Eating and drinking  Eat a diet that includes fresh fruits and vegetables, whole grains, lean protein, and low-fat dairy products. Limit your intake of foods with high amounts of sugar, saturated fats, and salt. Take vitamin and mineral supplements as recommended by your health care provider. Do not drink alcohol if your health care provider tells you not to drink. If  you drink alcohol: Limit how much you have to 0-1 drink a day. Be aware of how much alcohol is in your drink. In the [place] one drink equals one 12 oz bottle of beer (355 mL), one 5 oz glass of  wine (148 mL), or one 1 oz glass of hard liquor (44 mL).  Lifestyle Take daily care of your teeth and gums. Brush your teeth every morning and night with fluoride toothpaste. Floss one time each day. Stay active. Exercise for at least 30 minutes 5 or more days each week. Do not use any products that contain nicotine or tobacco, such as cigarettes, e-cigarettes, and chewing tobacco. If you need help quitting, ask your health care provider. Do not use drugs. If you are sexually active, practice safe sex. Use a condom or other form of protection in order to prevent STIs (sexually transmitted infections). Talk with your health care provider about taking a low-dose aspirin or statin. Find healthy ways to cope with stress, such as: Meditation, yoga, or listening to music. Journaling. Talking to a trusted person. Spending time with friends and family. Safety Always wear your seat belt while driving or riding in a vehicle. Do not drive: If you have been drinking alcohol. Do not ride with someone who has been drinking. When you are tired or distracted. While texting. Wear a helmet and other protective equipment during sports activities. If you have firearms in your house, make sure you follow all gun safety procedures. What's next? Visit your health care provider once a year for an annual wellness visit. Ask your health care provider how often you should have your eyes and teeth checked. Stay up to date on all vaccines. This information is not intended to replace advice given to you by your health care provider. Make sure you discuss any questions you have with your healthcare provider. Document Revised: 11/11/2020 Document Reviewed: 11/15/2018 Elsevier Patient Education  2022 [employer].

## 2021-06-03 NOTE — Assessment & Plan Note (Signed)
Overall taking levothyroxine correctly, continue 25 mcg. Repeat TSH pending.

## 2021-06-03 NOTE — Assessment & Plan Note (Signed)
Repeat A1C pending. 

## 2021-06-03 NOTE — Assessment & Plan Note (Signed)
Compliant to atorvastatin 20 mg, continue same.  Repeat lipid panel pending.

## 2021-06-03 NOTE — Assessment & Plan Note (Signed)
Stable. No concerns today.

## 2021-06-03 NOTE — Progress Notes (Signed)
Subjective:    Patient ID: Gloria Neal, female    DOB: Oct 18, 1956, 65 y.o.   MRN: [id number]  HPI  Gloria Neal is a very pleasant 65 y.o. female who presents today for complete physical.  She would also like to discuss difficulty sleeping. Difficulty falling and staying asleep, most nights. When she wakes it will take her a few minutes to fall back asleep. Sometimes she has racing thoughts. She underwent a sleep study a few years ago was told that it was negative. She does snore. She does have Melatonin, has not tried recently. She denies anxiety. Chronic for years. She does occasionally take half of one of her husbands "sleeping pills" with improvement. This is rare.   Immunizations: -Tetanus: 2018 -Influenza: Due this season  -Covid-19: 4 vaccines -Shingles: Shingrix and Zostavax -Pneumonia: Pneumovax 2016   Diet: [place].  Exercise: She is walking 7 days weekly  Eye exam: Completes annually  Dental exam: Completes semi-annually   Mammogram: Completed in July 2021 Dexa: Due Colonoscopy: Completed in 2013, due 2023  Lung Cancer Screening: Completed in August 2021   BP Readings from Last 3 Encounters:  06/03/21 136/74  05/07/20 (!) 142/78  08/07/18 122/84      Review of Systems  Constitutional:  Negative for unexpected weight change.  HENT:  Negative for rhinorrhea.   Eyes:  Negative for visual disturbance.  Respiratory:  Negative for shortness of breath.   Cardiovascular:  Negative for chest pain.  Gastrointestinal:  Negative for constipation and diarrhea.  Genitourinary:  Negative for difficulty urinating.  Musculoskeletal:  Negative for arthralgias and myalgias.  Skin:  Negative for rash.  Allergic/Immunologic: Negative for environmental allergies.  Neurological:  Negative for dizziness and headaches.  Psychiatric/Behavioral:  The patient is nervous/anxious.         Past Medical History:  Diagnosis Date   Cervical dysplasia    Heart murmur     no longer when she has younger   Hypertension    Mitral valve prolapse    Rheumatic fever    65 years old   Thyroid disease    Tobacco abuse    Vertigo     Social History   Socioeconomic History   Marital status: Married    Spouse name: Not on file   Number of children: Not on file   Years of education: Not on file   Highest education level: Not on file  Occupational History   Occupation: [employer]  Tobacco Use   Smoking status: Former    Packs/day: 1.50    Years: 44.00    Pack years: 66.00    Types: 26, Cigarettes    Quit date: 03/06/2019    Years since quitting: 2.2   Smokeless tobacco: Never   Tobacco comments:    2 cigars a day  Substance and Sexual Activity   Alcohol use: Yes    Alcohol/week: 0.0 standard drinks    Comment: occassional   Drug use: No   Sexual activity: Not on file  Other Topics Concern   Not on file  Social History Narrative   Married.   No children.   Moved from [place] last August for job.   Vacations in [place] every year.   Enjoys reading in her free time.   Social Determinants of Health   Financial Resource Strain: Not on file  Food Insecurity: Not on file  Transportation Needs: Not on file  Physical Activity: Not on file  Stress: Not on file  Social  Connections: Not on file  Intimate Partner Violence: Not on file    Past Surgical History:  Procedure Laterality Date   CYSTECTOMY     right brow and buttocks   TONSILLECTOMY  1972    Family History  Problem Relation Age of Onset   Glaucoma Mother    Hypothyroidism Mother    Cancer Father        Lung CA   Arthritis Maternal Grandmother    Hypothyroidism Sister     No Known Allergies  Current Outpatient Medications on File Prior to Visit  Medication Sig Dispense Refill   atorvastatin (LIPITOR) 20 MG tablet TAKE 1 TABLET BY MOUTH EVERY EVENING FOR CHOLESTEROL 90 tablet 3   buPROPion (WELLBUTRIN SR) 150 MG 12 hr tablet TAKE 1 TABLET(150 MG) BY MOUTH TWICE DAILY 180 tablet  3   levothyroxine (SYNTHROID) 25 MCG tablet Take 1 tablet by mouth every morning on an empty stomach with water only.  No food or other medications for 30 minutes. 30 tablet 0   No current facility-administered medications on file prior to visit.    BP 136/74   Pulse 72   Temp (!) 96.4 F (35.8 C) (Temporal)   Ht 5\' 7"  (1.702 m)   Wt 161 lb (73 kg)   SpO2 98%   BMI 25.22 kg/m  Objective:   Physical Exam HENT:     Right Ear: Tympanic membrane and ear canal normal.     Left Ear: Tympanic membrane and ear canal normal.     Nose: Nose normal.  Eyes:     Conjunctiva/sclera: Conjunctivae normal.     Pupils: Pupils are equal, round, and reactive to light.  Neck:     Thyroid: No thyromegaly.  Cardiovascular:     Rate and Rhythm: Normal rate and regular rhythm.     Heart sounds: No murmur heard. Pulmonary:     Effort: Pulmonary effort is normal.     Breath sounds: Normal breath sounds. No rales.  Abdominal:     General: Bowel sounds are normal.     Palpations: Abdomen is soft.     Tenderness: There is no abdominal tenderness.  Musculoskeletal:        General: Normal range of motion.     Cervical back: Neck supple.  Lymphadenopathy:     Cervical: No cervical adenopathy.  Skin:    General: Skin is warm and dry.     Findings: No rash.  Neurological:     Mental Status: She is alert and oriented to person, place, and time.     Cranial Nerves: No cranial nerve deficit.     Deep Tendon Reflexes: Reflexes are normal and symmetric.  Psychiatric:        Mood and Affect: Mood normal.          Assessment & Plan:      This visit occurred during the SARS-CoV-2 public health emergency.  Safety protocols were in place, including screening questions prior to the visit, additional usage of staff PPE, and extensive cleaning of exam room while observing appropriate contact time as indicated for disinfecting solutions.

## 2021-06-03 NOTE — Assessment & Plan Note (Signed)
Denies concerns today. °Continue to monitor. °

## 2021-06-03 NOTE — Assessment & Plan Note (Signed)
Negative sleep study in 2016, symptoms continue. Consider repeat sleep study. She will try melatonin and update.

## 2021-06-03 NOTE — Assessment & Plan Note (Signed)
Doing well on Wellbutrin for which she is taking once daily. Rx changed. Continue Wellbutrin SR 150 mg daily.

## 2021-06-08 ENCOUNTER — Other Ambulatory Visit: Payer: Self-pay | Admitting: Primary Care

## 2021-06-08 DIAGNOSIS — E785 Hyperlipidemia, unspecified: Secondary | ICD-10-CM

## 2021-06-23 ENCOUNTER — Ambulatory Visit
Admission: RE | Admit: 2021-06-23 | Discharge: 2021-06-23 | Disposition: A | Discharge status: Home / Self Care | Payer: Medicare Other | Source: Ambulatory Visit | Attending: Primary Care | Admitting: Primary Care

## 2021-06-23 ENCOUNTER — Other Ambulatory Visit: Payer: Self-pay

## 2021-06-23 DIAGNOSIS — Z1231 Encounter for screening mammogram for malignant neoplasm of breast: Secondary | ICD-10-CM

## 2021-08-03 ENCOUNTER — Other Ambulatory Visit: Payer: Self-pay

## 2021-08-03 ENCOUNTER — Ambulatory Visit
Admission: RE | Admit: 2021-08-03 | Discharge: 2021-08-03 | Disposition: A | Discharge status: Home / Self Care | Payer: Medicare Other | Source: Ambulatory Visit | Attending: Acute Care | Admitting: Acute Care

## 2021-08-03 DIAGNOSIS — Z87891 Personal history of nicotine dependence: Secondary | ICD-10-CM | POA: Insufficient documentation

## 2021-08-03 DIAGNOSIS — F17210 Nicotine dependence, cigarettes, uncomplicated: Secondary | ICD-10-CM | POA: Diagnosis not present

## 2021-08-19 ENCOUNTER — Encounter: Payer: Self-pay | Admitting: *Deleted

## 2021-08-19 DIAGNOSIS — F17210 Nicotine dependence, cigarettes, uncomplicated: Secondary | ICD-10-CM

## 2021-08-29 NOTE — Progress Notes (Signed)
Result Letter Sent by Doroteo Glassman RN  12 month follow up order placed

## 2021-11-17 ENCOUNTER — Other Ambulatory Visit: Payer: Self-pay

## 2021-11-17 ENCOUNTER — Ambulatory Visit (INDEPENDENT_AMBULATORY_CARE_PROVIDER_SITE_OTHER): Payer: Medicare Other

## 2021-11-17 DIAGNOSIS — Z23 Encounter for immunization: Secondary | ICD-10-CM | POA: Diagnosis not present

## 2021-11-17 NOTE — Progress Notes (Signed)
Per orders of [name] NP, an injection of Prevnar 20 was given by Ophelia Shoulder, CMA. Patient tolerated injection well.

## 2021-12-08 ENCOUNTER — Other Ambulatory Visit: Payer: Self-pay

## 2021-12-08 ENCOUNTER — Ambulatory Visit
Admission: RE | Admit: 2021-12-08 | Discharge: 2021-12-08 | Disposition: A | Discharge status: Home / Self Care | Payer: Self-pay | Source: Ambulatory Visit | Attending: Primary Care | Admitting: Primary Care

## 2021-12-08 DIAGNOSIS — Z780 Asymptomatic menopausal state: Secondary | ICD-10-CM | POA: Diagnosis not present

## 2021-12-08 DIAGNOSIS — E2839 Other primary ovarian failure: Secondary | ICD-10-CM

## 2022-03-28 ENCOUNTER — Telehealth: Payer: Self-pay | Admitting: Primary Care

## 2022-03-28 NOTE — Telephone Encounter (Signed)
Patient should still have a 90 day refill at pharmacy. She will contact pharmacy to get next refill.  ?

## 2022-03-28 NOTE — Telephone Encounter (Signed)
Caller Name: [facility] ? ? ?MEDICATION(S):  ? atorvastatin (LIPITOR) 20 MG tablet ? ? ?~~~Please advise patient/caregiver to allow 2-3 business days to process RX refills.  ?

## 2022-04-07 ENCOUNTER — Other Ambulatory Visit: Payer: Self-pay

## 2022-04-07 DIAGNOSIS — E785 Hyperlipidemia, unspecified: Secondary | ICD-10-CM

## 2022-04-25 ENCOUNTER — Other Ambulatory Visit: Payer: Self-pay | Admitting: Primary Care

## 2022-04-25 DIAGNOSIS — E785 Hyperlipidemia, unspecified: Secondary | ICD-10-CM

## 2022-05-26 ENCOUNTER — Other Ambulatory Visit: Payer: Self-pay | Admitting: Primary Care

## 2022-05-26 DIAGNOSIS — E039 Hypothyroidism, unspecified: Secondary | ICD-10-CM

## 2022-06-09 ENCOUNTER — Encounter: Payer: Self-pay | Admitting: Primary Care

## 2022-06-09 ENCOUNTER — Other Ambulatory Visit: Payer: Self-pay

## 2022-06-09 ENCOUNTER — Ambulatory Visit (INDEPENDENT_AMBULATORY_CARE_PROVIDER_SITE_OTHER): Payer: No Typology Code available for payment source | Admitting: Primary Care

## 2022-06-09 VITALS — BP 136/72 | HR 67 | Temp 97.6°F | Ht 67.0 in | Wt 152.0 lb

## 2022-06-09 DIAGNOSIS — E785 Hyperlipidemia, unspecified: Secondary | ICD-10-CM | POA: Diagnosis not present

## 2022-06-09 DIAGNOSIS — M1991 Primary osteoarthritis, unspecified site: Secondary | ICD-10-CM | POA: Diagnosis not present

## 2022-06-09 DIAGNOSIS — I10 Essential (primary) hypertension: Secondary | ICD-10-CM | POA: Diagnosis not present

## 2022-06-09 DIAGNOSIS — G479 Sleep disorder, unspecified: Secondary | ICD-10-CM

## 2022-06-09 DIAGNOSIS — E039 Hypothyroidism, unspecified: Secondary | ICD-10-CM | POA: Diagnosis not present

## 2022-06-09 DIAGNOSIS — R7303 Prediabetes: Secondary | ICD-10-CM

## 2022-06-09 DIAGNOSIS — Z1211 Encounter for screening for malignant neoplasm of colon: Secondary | ICD-10-CM

## 2022-06-09 DIAGNOSIS — Z1231 Encounter for screening mammogram for malignant neoplasm of breast: Secondary | ICD-10-CM

## 2022-06-09 DIAGNOSIS — Z720 Tobacco use: Secondary | ICD-10-CM | POA: Diagnosis not present

## 2022-06-09 DIAGNOSIS — Z0000 Encounter for general adult medical examination without abnormal findings: Secondary | ICD-10-CM | POA: Diagnosis not present

## 2022-06-09 LAB — LIPID PANEL
Cholesterol: 136 mg/dL (ref 0–200)
HDL: 52.2 mg/dL (ref >39.00)
LDL Cholesterol: 68 mg/dL (ref 0–99)
NonHDL: 84.08
Total CHOL/HDL Ratio: 3
Triglycerides: 81 mg/dL (ref 0.0–149.0)
VLDL: 16.2 mg/dL (ref 0.0–40.0)

## 2022-06-09 LAB — COMPREHENSIVE METABOLIC PANEL
ALT: 11 U/L (ref 0–35)
AST: 13 U/L (ref 0–37)
Albumin: 4.1 g/dL (ref 3.5–5.2)
Alkaline Phosphatase: 69 U/L (ref 39–117)
BUN: 11 mg/dL (ref 6–23)
CO2: 28 mEq/L (ref 19–32)
Calcium: 9.5 mg/dL (ref 8.4–10.5)
Chloride: 106 mEq/L (ref 96–112)
Creatinine, Ser: 0.91 mg/dL (ref 0.40–1.20)
GFR: 65.83 mL/min (ref >60.00)
Glucose, Bld: 102 mg/dL — ABNORMAL HIGH (ref 70–99)
Potassium: 4.9 mEq/L (ref 3.5–5.1)
Sodium: 140 mEq/L (ref 135–145)
Total Bilirubin: 0.4 mg/dL (ref 0.2–1.2)
Total Protein: 6.2 g/dL (ref 6.0–8.3)

## 2022-06-09 LAB — HEMOGLOBIN A1C: Hgb A1c MFr Bld: 5.9 % (ref 4.6–6.5)

## 2022-06-09 LAB — TSH: TSH: 0.94 u[IU]/mL (ref 0.35–5.50)

## 2022-06-09 MED ORDER — NA SULFATE-K SULFATE-MG SULF 17.5-3.13-1.6 GM/177ML PO SOLN
1.0000 | Freq: Once | ORAL | 0 refills | Status: AC
Start: 2022-06-09 — End: 2022-06-09

## 2022-06-09 NOTE — Progress Notes (Signed)
Gastroenterology Pre-Procedure Review  Request Date: 06/22/2022 Requesting Physician: Dr. Marius Ditch  PATIENT REVIEW QUESTIONS: The patient responded to the following health history questions as indicated:    1. Are you having any GI issues? no 2. Do you have a personal history of Polyps? no 3. Do you have a family history of Colon Cancer or Polyps? no 4. Diabetes Mellitus? no 5. Joint replacements in the past 12 months?no 6. Major health problems in the past 3 months?no 7. Any artificial heart valves, MVP, or defibrillator?no    MEDICATIONS & ALLERGIES:    Patient reports the following regarding taking any anticoagulation/antiplatelet therapy:   Plavix, Coumadin, Eliquis, Xarelto, Lovenox, Pradaxa, Brilinta, or Effient? no Aspirin? no  Patient confirms/reports the following medications:  Current Outpatient Medications  Medication Sig Dispense Refill   atorvastatin (LIPITOR) 20 MG tablet TAKE 1 TABLET BY MOUTH EVERY EVENING FOR CHOLESTEROL 90 tablet 3   buPROPion (WELLBUTRIN SR) 150 MG 12 hr tablet Take 1 tablet (150 mg total) by mouth daily. 90 tablet 3   levothyroxine (SYNTHROID) 25 MCG tablet Take 1 tablet by mouth every morning on an empty stomach with water only.  No food or other medications for 30 minutes. Office visit required for further refills. 90 tablet 0   No current facility-administered medications for this visit.    Patient confirms/reports the following allergies:  No Known Allergies  No orders of the defined types were placed in this encounter.   AUTHORIZATION INFORMATION Primary Insurance: 1D#: Group #:  Secondary Insurance: 1D#: Group #:  SCHEDULE INFORMATION: Date: 06/22/2022 Time: Location:[facility]

## 2022-06-09 NOTE — Assessment & Plan Note (Signed)
Controlled.  Continue to monitor off meds.

## 2022-06-09 NOTE — Assessment & Plan Note (Signed)
Repeat lipid panel pending.  Continue atorvastatin 20 mg daily. 

## 2022-06-09 NOTE — Assessment & Plan Note (Signed)
She is taking levothyroxine correctly.  Continue levothyroxine 25 mcg daily. Repeat TSH.

## 2022-06-09 NOTE — Assessment & Plan Note (Signed)
Repeat A1C pending.  Discussed the importance of a healthy diet and regular exercise in order for weight loss, and to reduce the risk of further co-morbidity. Commended her on daily walking.

## 2022-06-09 NOTE — Assessment & Plan Note (Signed)
Overall stable, no concerns today. Continue PRN melatonin.

## 2022-06-09 NOTE — Assessment & Plan Note (Signed)
Intermittent smoking, still doing well on bupropion.   Continue bupropion SR 150 mg daily.

## 2022-06-09 NOTE — Assessment & Plan Note (Signed)
Immunizations UTD. Mammogram due, orders placed. Colonoscopy due, referral placed to GI. Lung cancer screening UTD and scheduled.   Discussed the importance of a healthy diet and regular exercise in order for weight loss, and to reduce the risk of further co-morbidity.  Exam stable. Labs pending.  Follow up in 1 year for repeat physical.

## 2022-06-09 NOTE — Assessment & Plan Note (Signed)
Stable.  No concerns today. Continue to monitor.  

## 2022-06-09 NOTE — Patient Instructions (Signed)
Stop by the lab prior to leaving today. I will notify you of your results once received.   You will be contacted regarding your referral to GI for the colonoscopy.  Please let us know if you have not been contacted within two weeks.   [facility] to schedule your mamm[facility].   It was a pleasure to see you today!  Preventive Care 11 Years and Older, Female Preventive care refers to lifestyle choices and visits with your health care provider that can promote health and wellness. Preventive care visits are also called wellness exams. What can I expect for my preventive care visit? Counseling Your health care provider may ask you questions about your: Medical history, including: Past medical problems. Family medical history. Pregnancy and menstrual history. History of falls. Current health, including: Memory and ability to understand (cognition). Emotional well-being. Home life and relationship well-being. Sexual activity and sexual health. Lifestyle, including: Alcohol, nicotine or tobacco, and drug use. Access to firearms. Diet, exercise, and sleep habits. Work and work Statistician. Sunscreen use. Safety issues such as seatbelt and bike helmet use. Physical exam Your health care provider will check your: Height and weight. These may be used to calculate your BMI (body mass index). BMI is a measurement that tells if you are at a healthy weight. Waist circumference. This measures the distance around your waistline. This measurement also tells if you are at a healthy weight and may help predict your risk of certain diseases, such as type 2 diabetes and high blood pressure. Heart rate and blood pressure. Body temperature. Skin for abnormal spots. What immunizations do I need?  Vaccines are usually given at various ages, according to a schedule. Your health care provider will recommend vaccines for you based on your age, medical history, and lifestyle or other factors, such  as travel or where you work. What tests do I need? Screening Your health care provider may recommend screening tests for certain conditions. This may include: Lipid and cholesterol levels. Hepatitis C test. Hepatitis B test. HIV (human immunodeficiency virus) test. STI (sexually transmitted infection) testing, if you are at risk. Lung cancer screening. Colorectal cancer screening. Diabetes screening. This is done by checking your blood sugar (glucose) after you have not eaten for a while (fasting). Mammogram. Talk with your health care provider about how often you should have regular mammograms. BRCA-related cancer screening. This may be done if you have a family history of breast, ovarian, tubal, or peritoneal cancers. Bone density scan. This is done to screen for osteoporosis. Talk with your health care provider about your test results, treatment options, and if necessary, the need for more tests. Follow these instructions at home: Eating and drinking  Eat a diet that includes fresh fruits and vegetables, whole grains, lean protein, and low-fat dairy products. Limit your intake of foods with high amounts of sugar, saturated fats, and salt. Take vitamin and mineral supplements as recommended by your health care provider. Do not drink alcohol if your health care provider tells you not to drink. If you drink alcohol: Limit how much you have to 0-1 drink a day. Know how much alcohol is in your drink. In the [place] one drink equals one 12 oz bottle of beer (355 mL), one 5 oz glass of wine (148 mL), or one 1 oz glass of hard liquor (44 mL). Lifestyle Brush your teeth every morning and night with fluoride toothpaste. Floss one time each day. Exercise for at least 30 minutes 5 or more  days each week. Do not use any products that contain nicotine or tobacco. These products include cigarettes, chewing tobacco, and vaping devices, such as e-cigarettes. If you need help quitting, ask your health  care provider. Do not use drugs. If you are sexually active, practice safe sex. Use a condom or other form of protection in order to prevent STIs. Take aspirin only as told by your health care provider. Make sure that you understand how much to take and what form to take. Work with your health care provider to find out whether it is safe and beneficial for you to take aspirin daily. Ask your health care provider if you need to take a cholesterol-lowering medicine (statin). Find healthy ways to manage stress, such as: Meditation, yoga, or listening to music. Journaling. Talking to a trusted person. Spending time with friends and family. Minimize exposure to UV radiation to reduce your risk of skin cancer. Safety Always wear your seat belt while driving or riding in a vehicle. Do not drive: If you have been drinking alcohol. Do not ride with someone who has been drinking. When you are tired or distracted. While texting. If you have been using any mind-altering substances or drugs. Wear a helmet and other protective equipment during sports activities. If you have firearms in your house, make sure you follow all gun safety procedures. What's next? Visit your health care provider once a year for an annual wellness visit. Ask your health care provider how often you should have your eyes and teeth checked. Stay up to date on all vaccines. This information is not intended to replace advice given to you by your health care provider. Make sure you discuss any questions you have with your health care provider. Document Revised: 05/19/2021 Document Reviewed: 05/19/2021 Elsevier Patient Education  [place].

## 2022-06-09 NOTE — Progress Notes (Signed)
Subjective:    Patient ID: Gloria Neal, female    DOB: November 21, 1956, 66 y.o.   MRN: [id number]  HPI  Gloria Neal is a very pleasant 66 y.o. female who presents today for complete physical and follow up of chronic conditions.  Immunizations: -Tetanus: 2018 -Influenza: Completed last season  -Covid-19:4 vaccines  -Shingles: Completed Zostavax and Shingrix -Pneumonia: Prevnar 20 in 2022, pneumovax in 2016  Diet: [place].  Exercise: Walking 4 miles daily   Eye exam: Completes annually  Dental exam: Completes semi-annually   Mammogram: Completed in July 2022  Colonoscopy: Completed in 2013, due 2023 Lung Cancer Screening: Completed in August 2022, scheduled for August 2023 Dexa: Completed in 2023   BP Readings from Last 3 Encounters:  06/09/22 136/72  06/03/21 136/74  05/07/20 (!) 142/78        Review of Systems  Constitutional:  Negative for unexpected weight change.  HENT:  Negative for rhinorrhea.   Eyes:  Negative for visual disturbance.  Respiratory:  Negative for cough and shortness of breath.   Cardiovascular:  Negative for chest pain.  Gastrointestinal:  Negative for constipation and diarrhea.  Genitourinary:  Negative for difficulty urinating.  Musculoskeletal:  Positive for arthralgias.  Skin:  Negative for rash.  Allergic/Immunologic: Negative for environmental allergies.  Neurological:  Negative for dizziness and headaches.  Psychiatric/Behavioral:  The patient is not nervous/anxious.          Past Medical History:  Diagnosis Date   Acute shoulder pain 08/07/2018   Cervical dysplasia    Heart murmur    no longer when she has younger   Hypertension    Mitral valve prolapse    Rheumatic fever    66 years old   Thyroid disease    Tobacco abuse    Vertigo     Social History   Socioeconomic History   Marital status: Married    Spouse name: Not on file   Number of children: Not on file   Years of education: Not on file   Highest  education level: Not on file  Occupational History   Occupation: [employer]  Tobacco Use   Smoking status: Former    Packs/day: 1.50    Years: 44.00    Total pack years: 66.00    Types: Cigars, Cigarettes    Quit date: 03/06/2019    Years since quitting: 3.2   Smokeless tobacco: Never   Tobacco comments:    2 cigars a day  Substance and Sexual Activity   Alcohol use: Yes    Alcohol/week: 0.0 standard drinks of alcohol    Comment: occassional   Drug use: No   Sexual activity: Not on file  Other Topics Concern   Not on file  Social History Narrative   Married.   No children.   Moved from [place] last August for job.   Vacations in [place] every year.   Enjoys reading in her free time.   Social Determinants of Health   Financial Resource Strain: Not on file  Food Insecurity: Not on file  Transportation Needs: Not on file  Physical Activity: Not on file  Stress: Not on file  Social Connections: Not on file  Intimate Partner Violence: Not on file    Past Surgical History:  Procedure Laterality Date   CYSTECTOMY     right brow and buttocks   TONSILLECTOMY  1972    Family History  Problem Relation Age of Onset   Glaucoma Mother    Hypothyroidism  Mother    Cancer Father        Lung CA   Arthritis Maternal Grandmother    Hypothyroidism Sister     No Known Allergies  Current Outpatient Medications on File Prior to Visit  Medication Sig Dispense Refill   atorvastatin (LIPITOR) 20 MG tablet TAKE 1 TABLET BY MOUTH EVERY EVENING FOR CHOLESTEROL 90 tablet 3   buPROPion (WELLBUTRIN SR) 150 MG 12 hr tablet Take 1 tablet (150 mg total) by mouth daily. 90 tablet 3   levothyroxine (SYNTHROID) 25 MCG tablet Take 1 tablet by mouth every morning on an empty stomach with water only.  No food or other medications for 30 minutes. Office visit required for further refills. 90 tablet 0   No current facility-administered medications on file prior to visit.    BP 136/72   Pulse 67    Temp 97.6 F (36.4 C) (Oral)   Ht '5\' 7"'$  (1.702 m)   Wt 152 lb (68.9 kg)   SpO2 97%   BMI 23.81 kg/m  Objective:   Physical Exam HENT:     Right Ear: Tympanic membrane and ear canal normal.     Left Ear: Tympanic membrane and ear canal normal.     Nose: Nose normal.  Eyes:     Conjunctiva/sclera: Conjunctivae normal.     Pupils: Pupils are equal, round, and reactive to light.  Neck:     Thyroid: No thyromegaly.  Cardiovascular:     Rate and Rhythm: Normal rate and regular rhythm.     Heart sounds: No murmur heard. Pulmonary:     Effort: Pulmonary effort is normal.     Breath sounds: Normal breath sounds. No rales.  Abdominal:     General: Bowel sounds are normal.     Palpations: Abdomen is soft.     Tenderness: There is no abdominal tenderness.  Musculoskeletal:        General: Normal range of motion.     Cervical back: Neck supple.  Lymphadenopathy:     Cervical: No cervical adenopathy.  Skin:    General: Skin is warm and dry.     Findings: No rash.  Neurological:     Mental Status: She is alert and oriented to person, place, and time.     Cranial Nerves: No cranial nerve deficit.     Deep Tendon Reflexes: Reflexes are normal and symmetric.  Psychiatric:        Mood and Affect: Mood normal.           Assessment & Plan:   Problem List Items Addressed This Visit       Cardiovascular and Mediastinum   Essential hypertension    Controlled.  Continue to monitor off meds.      Relevant Orders   Comprehensive metabolic panel     Endocrine   Hypothyroidism    She is taking levothyroxine correctly.  Continue levothyroxine 25 mcg daily. Repeat TSH.       Relevant Orders   TSH     Musculoskeletal and Integument   Primary osteoarthritis    Stable.  No concerns today.  Continue to monitor.         Other   Tobacco abuse    Intermittent smoking, still doing well on bupropion.   Continue bupropion SR 150 mg daily.      Preventative health  care - Primary    Immunizations UTD. Mammogram due, orders placed. Colonoscopy due, referral placed to GI. Lung cancer screening UTD and  scheduled.   Discussed the importance of a healthy diet and regular exercise in order for weight loss, and to reduce the risk of further co-morbidity.  Exam stable. Labs pending.  Follow up in 1 year for repeat physical.       Hyperlipidemia    Repeat lipid panel pending.  Continue atorvastatin 20 mg daily.      Relevant Orders   Lipid panel   Sleep disorder    Overall stable, no concerns today. Continue PRN melatonin.       Prediabetes    Repeat A1C pending.  Discussed the importance of a healthy diet and regular exercise in order for weight loss, and to reduce the risk of further co-morbidity. Commended her on daily walking.      Relevant Orders   Hemoglobin A1c   Other Visit Diagnoses     Screening for colon cancer       Relevant Orders   Ambulatory referral to Gastroenterology   Encounter for screening mammogram for malignant neoplasm of breast       Relevant Orders   MM 3D SCREEN BREAST BILATERAL          Pleas [name], NP

## 2022-06-22 ENCOUNTER — Ambulatory Visit: Payer: No Typology Code available for payment source | Admitting: General Practice

## 2022-06-22 ENCOUNTER — Encounter
Admission: RE | Disposition: A | Discharge status: Home / Self Care | Payer: Self-pay | Source: Home / Self Care | Attending: Gastroenterology

## 2022-06-22 ENCOUNTER — Ambulatory Visit
Admission: RE | Admit: 2022-06-22 | Discharge: 2022-06-22 | Disposition: A | Discharge status: Home / Self Care | Payer: No Typology Code available for payment source | Attending: Gastroenterology | Admitting: Gastroenterology

## 2022-06-22 ENCOUNTER — Encounter: Payer: Self-pay | Admitting: Gastroenterology

## 2022-06-22 DIAGNOSIS — K640 First degree hemorrhoids: Secondary | ICD-10-CM | POA: Insufficient documentation

## 2022-06-22 DIAGNOSIS — K5730 Diverticulosis of large intestine without perforation or abscess without bleeding: Secondary | ICD-10-CM | POA: Diagnosis not present

## 2022-06-22 DIAGNOSIS — Z87891 Personal history of nicotine dependence: Secondary | ICD-10-CM | POA: Insufficient documentation

## 2022-06-22 DIAGNOSIS — Z1211 Encounter for screening for malignant neoplasm of colon: Secondary | ICD-10-CM | POA: Diagnosis not present

## 2022-06-22 DIAGNOSIS — D126 Benign neoplasm of colon, unspecified: Secondary | ICD-10-CM

## 2022-06-22 DIAGNOSIS — K635 Polyp of colon: Secondary | ICD-10-CM | POA: Diagnosis not present

## 2022-06-22 DIAGNOSIS — D125 Benign neoplasm of sigmoid colon: Secondary | ICD-10-CM | POA: Insufficient documentation

## 2022-06-22 HISTORY — PX: COLONOSCOPY WITH PROPOFOL: SHX5780

## 2022-06-22 SURGERY — COLONOSCOPY WITH PROPOFOL
Anesthesia: General

## 2022-06-22 MED ORDER — LIDOCAINE HCL (CARDIAC) PF 100 MG/5ML IV SOSY
PREFILLED_SYRINGE | INTRAVENOUS | Status: DC | PRN
  Administered 2022-06-22: 60 mg via INTRAVENOUS

## 2022-06-22 MED ORDER — FENTANYL CITRATE (PF) 100 MCG/2ML IJ SOLN
INTRAMUSCULAR | Status: AC
  Filled 2022-06-22: qty 2

## 2022-06-22 MED ORDER — PROPOFOL 1000 MG/100ML IV EMUL
INTRAVENOUS | Status: AC
  Filled 2022-06-22: qty 100

## 2022-06-22 MED ORDER — MIDAZOLAM HCL 2 MG/2ML IJ SOLN
INTRAMUSCULAR | Status: AC
  Filled 2022-06-22: qty 2

## 2022-06-22 MED ORDER — STERILE WATER FOR IRRIGATION IR SOLN
Status: DC | PRN
  Administered 2022-06-22: 60 mL

## 2022-06-22 MED ORDER — LIDOCAINE HCL (PF) 2 % IJ SOLN
INTRAMUSCULAR | Status: AC
  Filled 2022-06-22: qty 5

## 2022-06-22 MED ORDER — PROPOFOL 500 MG/50ML IV EMUL
INTRAVENOUS | Status: DC | PRN
  Administered 2022-06-22: 50 ug/kg/min via INTRAVENOUS

## 2022-06-22 MED ORDER — SODIUM CHLORIDE 0.9 % IV SOLN: INTRAVENOUS | Status: DC

## 2022-06-22 MED ORDER — PROPOFOL 10 MG/ML IV BOLUS
INTRAVENOUS | Status: DC | PRN
  Administered 2022-06-22: 20 mg via INTRAVENOUS
  Administered 2022-06-22: 30 mg via INTRAVENOUS

## 2022-06-22 MED ORDER — MIDAZOLAM HCL 2 MG/2ML IJ SOLN
INTRAMUSCULAR | Status: DC | PRN
  Administered 2022-06-22: 2 mg via INTRAVENOUS

## 2022-06-22 NOTE — Anesthesia Preprocedure Evaluation (Signed)
Anesthesia Evaluation  Patient identified by MRN, date of birth, ID band Patient awake    Reviewed: Allergy & Precautions, NPO status , Patient's Chart, lab work & pertinent test results  Airway Mallampati: II  TM Distance: >3 FB Neck ROM: full    Dental  (+) Teeth Intact   Pulmonary neg pulmonary ROS, Patient abstained from smoking., former smoker,    Pulmonary exam normal        Cardiovascular hypertension, negative cardio ROS Normal cardiovascular exam     Neuro/Psych negative neurological ROS  negative psych ROS   GI/Hepatic negative GI ROS, Neg liver ROS,   Endo/Other  Hypothyroidism   Renal/GU negative Renal ROS  negative genitourinary   Musculoskeletal   Abdominal   Peds  Hematology negative hematology ROS (+)   Anesthesia Other Findings Past Medical History: 08/07/2018: Acute shoulder pain No date: Cervical dysplasia No date: Heart murmur     Comment:  no longer when she has younger No date: Hypertension No date: Mitral valve prolapse No date: Rheumatic fever     Comment:  66 years old No date: Thyroid disease No date: Tobacco abuse No date: Vertigo  Past Surgical History: No date: CYSTECTOMY     Comment:  right brow and buttocks 1972: TONSILLECTOMY  BMI    Body Mass Index: 23.49 kg/m      Reproductive/Obstetrics negative OB ROS                             Anesthesia Physical Anesthesia Plan  ASA: 2  Anesthesia Plan: General   Post-op Pain Management:    Induction: Intravenous  PONV Risk Score and Plan: 3 and Propofol infusion and TIVA  Airway Management Planned: Natural Airway and Nasal Cannula  Additional Equipment:   Intra-op Plan:   Post-operative Plan:   Informed Consent: I have reviewed the patients History and Physical, chart, labs and discussed the procedure including the risks, benefits and alternatives for the proposed anesthesia with the  patient or authorized representative who has indicated his/her understanding and acceptance.     Dental Advisory Given  Plan Discussed with: Anesthesiologist, CRNA and Surgeon  Anesthesia Plan Comments: (Patient consented for risks of anesthesia including but not limited to:  - adverse reactions to medications - risk of airway placement if required - damage to eyes, teeth, lips or other oral mucosa - nerve damage due to positioning  - sore throat or hoarseness - Damage to heart, brain, nerves, lungs, other parts of body or loss of life  Patient voiced understanding.)        Anesthesia Quick Evaluation

## 2022-06-22 NOTE — Op Note (Signed)
[facility] Patient Name: Yuliya Nova Procedure Date: 06/22/2022 8:42 AM MRN: [id number] Account #: [id number] Date of Birth: 08-24-1956 Admit Type: Outpatient Age: 66 Room: [facility] ENDO ROOM 3 Gender: Female Note Status: Finalized Instrument Name: Park Meo [id number] Procedure:             Colonoscopy Indications:           Screening for colorectal malignant neoplasm Providers:             Jonathon Bellows MD, MD Referring MD:          Jonathon Bellows MD, MD (Referring MD), Pleas [name]                         (Referring MD) Medicines:             Monitored Anesthesia Care Complications:         No immediate complications. Procedure:             Pre-Anesthesia Assessment:                        - Prior to the procedure, a History and Physical was                         performed, and patient medications, allergies and                         sensitivities were reviewed. The patient's tolerance                         of previous anesthesia was reviewed.                        - The risks and benefits of the procedure and the                         sedation options and risks were discussed with the                         patient. All questions were answered and informed                         consent was obtained.                        - ASA Grade Assessment: II - A patient with mild                         systemic disease.                        After obtaining informed consent, the colonoscope was                         passed under direct vision. Throughout the procedure,                         the patient's blood pressure, pulse, and oxygen                         saturations were  monitored continuously. The                         Colonoscope was introduced through the anus and                         advanced to the the cecum, identified by the                         appendiceal orifice. The colonoscopy was performed                          with ease. The patient tolerated the procedure well.                         The quality of the bowel preparation was excellent. Findings:      The perianal and digital rectal examinations were normal.      Multiple small-mouthed diverticula were found in the sigmoid colon.      Two sessile polyps were found in the ascending colon. The polyps were 6       to 8 mm in size. These polyps were removed with a cold snare. Resection       and retrieval were complete.      A 7 mm polyp was found in the sigmoid colon. The polyp was sessile. The       polyp was removed with a cold snare. Resection and retrieval were       complete.      Two sessile polyps were found in the rectum. The polyps were 4 to 5 mm       in size. These polyps were removed with a hot snare. Resection and       retrieval were complete.      Internal hemorrhoids were found during retroflexion. The hemorrhoids       were large and Grade I (internal hemorrhoids that do not prolapse).      The exam was otherwise without abnormality on direct and retroflexion       views. Impression:            - Diverticulosis in the sigmoid colon.                        - Two 6 to 8 mm polyps in the ascending colon, removed                         with a cold snare. Resected and retrieved.                        - One 7 mm polyp in the sigmoid colon, removed with a                         cold snare. Resected and retrieved.                        - Two 4 to 5 mm polyps in the rectum, removed with a                         hot snare. Resected and retrieved.                        -  Internal hemorrhoids.                        - The examination was otherwise normal on direct and                         retroflexion views. Recommendation:        - Discharge patient to home (with escort).                        - Resume previous diet.                        - Continue present medications.                        - Await pathology results.                         - Repeat colonoscopy for surveillance based on                         pathology results. Procedure Code(s):     --- Professional ---                        [phone], Colonoscopy, flexible; with removal of                         tumor(s), polyp(s), or other lesion(s) by snare                         technique Diagnosis Code(s):     --- Professional ---                        Z12.11, Encounter for screening for malignant neoplasm                         of colon                        K64.0, First degree hemorrhoids                        K62.1, Rectal polyp                        K63.5, Polyp of colon                        K57.30, Diverticulosis of large intestine without                         perforation or abscess without bleeding CPT copyright 2019 American Medical Association. All rights reserved. The codes documented in this report are preliminary and upon coder review may  be revised to meet current compliance requirements. Jonathon Bellows, MD Jonathon Bellows MD, MD 06/22/2022 9:25:27 AM This report has been signed electronically. Number of Addenda: 0 Note Initiated On: 06/22/2022 8:42 AM Scope Withdrawal Time: 0 hours 20 minutes 24 seconds  Total Procedure Duration: 0 hours 23 minutes 37 seconds  Estimated Blood Loss:  Estimated blood loss: none.      Vera  [facility]

## 2022-06-22 NOTE — H&P (Signed)
Jonathon Bellows, MD [address], [place], [place], [place], [address] [address], [place], [place], [place], [address] Phone: [phone]  Fax: [phone]  Primary Care Physician:  Pleas [name], NP   Pre-Procedure History & Physical: HPI:  Gloria Neal is a 66 y.o. female is here for an colonoscopy.   Past Medical History:  Diagnosis Date   Acute shoulder pain 08/07/2018   Cervical dysplasia    Heart murmur    no longer when she has younger   Hypertension    Mitral valve prolapse    Rheumatic fever    66 years old   Thyroid disease    Tobacco abuse    Vertigo     Past Surgical History:  Procedure Laterality Date   CYSTECTOMY     right brow and buttocks   TONSILLECTOMY  1972    Prior to Admission medications   Medication Sig Start Date End Date Taking? Authorizing Provider  atorvastatin (LIPITOR) 20 MG tablet TAKE 1 TABLET BY MOUTH EVERY EVENING FOR CHOLESTEROL 06/08/21  Yes Pleas [name], NP  buPROPion [facility] SR) 150 MG 12 hr tablet Take 1 tablet (150 mg total) by mouth daily. 06/03/21  Yes Pleas [name], NP  levothyroxine (SYNTHROID) 25 MCG tablet Take 1 tablet by mouth every morning on an empty stomach with water only.  No food or other medications for 30 minutes. Office visit required for further refills. 05/26/22  Yes Pleas [name], NP    Allergies as of 06/09/2022   (No Known Allergies)    Family History  Problem Relation Age of Onset   Glaucoma Mother    Hypothyroidism Mother    Cancer Father        Lung CA   Arthritis Maternal Grandmother    Hypothyroidism Sister     Social History   Socioeconomic History   Marital status: Married    Spouse name: Not on file   Number of children: Not on file   Years of education: Not on file   Highest education level: Not on file  Occupational History   Occupation: [employer]  Tobacco Use   Smoking status: Former    Packs/day: 1.50    Years: 44.00    Total pack years:  66.00    Types: Cigars, Cigarettes    Quit date: 03/06/2019    Years since quitting: 3.2   Smokeless tobacco: Never   Tobacco comments:    2 cigars a day  Substance and Sexual Activity   Alcohol use: Yes    Alcohol/week: 0.0 standard drinks of alcohol    Comment: occassional   Drug use: No   Sexual activity: Not on file  Other Topics Concern   Not on file  Social History Narrative   Married.   No children.   Moved from [place] last August for job.   Vacations in [place] every year.   Enjoys reading in her free time.   Social Determinants of Health   Financial Resource Strain: Not on file  Food Insecurity: Not on file  Transportation Needs: Not on file  Physical Activity: Not on file  Stress: Not on file  Social Connections: Not on file  Intimate Partner Violence: Not on file    Review of Systems: See HPI, otherwise negative ROS  Physical Exam: BP 122/89   Pulse 94   Temp (!) 96.8 F (36 C) (Temporal)   Resp 16   Ht '5\' 7"'$  (1.702 m)  Wt 68 kg   SpO2 98%   BMI 23.49 kg/m  General:   Alert,  pleasant and cooperative in NAD Head:  Normocephalic and atraumatic. Neck:  Supple; no masses or thyromegaly. Lungs:  Clear throughout to auscultation, normal respiratory effort.    Heart:  +S1, +S2, Regular rate and rhythm, No edema. Abdomen:  Soft, nontender and nondistended. Normal bowel sounds, without guarding, and without rebound.   Neurologic:  Alert and  oriented x4;  grossly normal neurologically.  Impression/Plan: Gloria Neal is here for an colonoscopy to be performed for Screening colonoscopy average risk   Risks, benefits, limitations, and alternatives regarding  colonoscopy have been reviewed with the patient.  Questions have been answered.  All parties agreeable.   Jonathon Bellows, MD  06/22/2022, 8:47 AM

## 2022-06-22 NOTE — Transfer of Care (Signed)
Immediate Anesthesia Transfer of Care Note  Patient: Gloria Neal  Procedure(s) Performed: COLONOSCOPY WITH PROPOFOL  Patient Location: PACU  Anesthesia Type:General  Level of Consciousness: sedated  Airway & Oxygen Therapy: Patient Spontanous Breathing and Patient connected to nasal cannula oxygen  Post-op Assessment: Report given to RN and Post -op Vital signs reviewed and stable  Post vital signs: Reviewed and stable  Last Vitals:  Vitals Value Taken Time  BP 130/63 06/22/22 0926  Temp    Pulse 69 06/22/22 0927  Resp 15 06/22/22 0927  SpO2 100 % 06/22/22 0927  Vitals shown include unvalidated device data.  Last Pain:  Vitals:   06/22/22 0926  TempSrc:   PainSc: 0-No pain         Complications: No notable events documented.

## 2022-06-22 NOTE — Anesthesia Postprocedure Evaluation (Signed)
Anesthesia Post Note  Patient: Gloria Neal  Procedure(s) Performed: COLONOSCOPY WITH PROPOFOL  Patient location during evaluation: Endoscopy Anesthesia Type: General Level of consciousness: awake and alert Pain management: pain level controlled Vital Signs Assessment: post-procedure vital signs reviewed and stable Respiratory status: spontaneous breathing, nonlabored ventilation, respiratory function stable and patient connected to nasal cannula oxygen Cardiovascular status: blood pressure returned to baseline and stable Postop Assessment: no apparent nausea or vomiting Anesthetic complications: no   No notable events documented.   Last Vitals:  Vitals:   06/22/22 0926 06/22/22 0943  BP: 130/63 140/70  Pulse:    Resp:    Temp:    SpO2:  99%    Last Pain:  Vitals:   06/22/22 0943  TempSrc:   PainSc: 0-No pain                 Dimas Millin

## 2022-06-23 ENCOUNTER — Encounter: Payer: Self-pay | Admitting: Gastroenterology

## 2022-06-23 LAB — SURGICAL PATHOLOGY

## 2022-06-24 ENCOUNTER — Encounter: Payer: Self-pay | Admitting: Gastroenterology

## 2022-06-24 ENCOUNTER — Ambulatory Visit
Admission: RE | Admit: 2022-06-24 | Discharge: 2022-06-24 | Disposition: A | Discharge status: Home / Self Care | Payer: No Typology Code available for payment source | Source: Ambulatory Visit | Attending: Primary Care | Admitting: Primary Care

## 2022-06-24 DIAGNOSIS — Z1231 Encounter for screening mammogram for malignant neoplasm of breast: Secondary | ICD-10-CM | POA: Diagnosis not present

## 2022-06-26 ENCOUNTER — Other Ambulatory Visit: Payer: Self-pay | Admitting: Primary Care

## 2022-06-26 DIAGNOSIS — E785 Hyperlipidemia, unspecified: Secondary | ICD-10-CM

## 2022-06-28 ENCOUNTER — Other Ambulatory Visit: Payer: Self-pay | Admitting: Primary Care

## 2022-06-28 DIAGNOSIS — R928 Other abnormal and inconclusive findings on diagnostic imaging of breast: Secondary | ICD-10-CM

## 2022-07-06 ENCOUNTER — Ambulatory Visit
Admission: RE | Admit: 2022-07-06 | Discharge: 2022-07-06 | Disposition: A | Discharge status: Home / Self Care | Payer: No Typology Code available for payment source | Source: Ambulatory Visit | Attending: Primary Care | Admitting: Primary Care

## 2022-07-06 ENCOUNTER — Other Ambulatory Visit: Payer: Self-pay | Admitting: Primary Care

## 2022-07-06 ENCOUNTER — Ambulatory Visit: Admission: RE | Admit: 2022-07-06 | Payer: No Typology Code available for payment source | Source: Ambulatory Visit

## 2022-07-06 DIAGNOSIS — R921 Mammographic calcification found on diagnostic imaging of breast: Secondary | ICD-10-CM | POA: Diagnosis not present

## 2022-07-06 DIAGNOSIS — R928 Other abnormal and inconclusive findings on diagnostic imaging of breast: Secondary | ICD-10-CM

## 2022-07-08 ENCOUNTER — Other Ambulatory Visit: Payer: Self-pay | Admitting: Primary Care

## 2022-07-08 DIAGNOSIS — R928 Other abnormal and inconclusive findings on diagnostic imaging of breast: Secondary | ICD-10-CM

## 2022-07-13 ENCOUNTER — Ambulatory Visit
Admission: RE | Admit: 2022-07-13 | Discharge: 2022-07-13 | Disposition: A | Discharge status: Home / Self Care | Payer: No Typology Code available for payment source | Source: Ambulatory Visit | Attending: Primary Care | Admitting: Primary Care

## 2022-07-13 DIAGNOSIS — R921 Mammographic calcification found on diagnostic imaging of breast: Secondary | ICD-10-CM | POA: Diagnosis not present

## 2022-07-13 DIAGNOSIS — R928 Other abnormal and inconclusive findings on diagnostic imaging of breast: Secondary | ICD-10-CM

## 2022-07-13 DIAGNOSIS — N6012 Diffuse cystic mastopathy of left breast: Secondary | ICD-10-CM | POA: Diagnosis not present

## 2022-07-25 ENCOUNTER — Other Ambulatory Visit: Payer: Self-pay | Admitting: Primary Care

## 2022-07-25 DIAGNOSIS — Z720 Tobacco use: Secondary | ICD-10-CM

## 2022-08-03 ENCOUNTER — Ambulatory Visit
Admission: RE | Admit: 2022-08-03 | Discharge: 2022-08-03 | Disposition: A | Discharge status: Home / Self Care | Payer: No Typology Code available for payment source | Source: Ambulatory Visit | Attending: Acute Care | Admitting: Acute Care

## 2022-08-03 DIAGNOSIS — I2510 Atherosclerotic heart disease of native coronary artery without angina pectoris: Secondary | ICD-10-CM | POA: Diagnosis not present

## 2022-08-03 DIAGNOSIS — I700 Atherosclerosis of aorta: Secondary | ICD-10-CM | POA: Insufficient documentation

## 2022-08-03 DIAGNOSIS — F17210 Nicotine dependence, cigarettes, uncomplicated: Secondary | ICD-10-CM | POA: Diagnosis not present

## 2022-08-03 DIAGNOSIS — Z122 Encounter for screening for malignant neoplasm of respiratory organs: Secondary | ICD-10-CM | POA: Insufficient documentation

## 2022-08-03 DIAGNOSIS — J439 Emphysema, unspecified: Secondary | ICD-10-CM | POA: Insufficient documentation

## 2022-08-03 DIAGNOSIS — Z87891 Personal history of nicotine dependence: Secondary | ICD-10-CM | POA: Diagnosis not present

## 2022-08-05 ENCOUNTER — Other Ambulatory Visit: Payer: Self-pay | Admitting: Acute Care

## 2022-08-05 DIAGNOSIS — Z122 Encounter for screening for malignant neoplasm of respiratory organs: Secondary | ICD-10-CM

## 2022-08-05 DIAGNOSIS — Z87891 Personal history of nicotine dependence: Secondary | ICD-10-CM

## 2022-08-17 DIAGNOSIS — R7303 Prediabetes: Secondary | ICD-10-CM | POA: Diagnosis not present

## 2022-08-17 DIAGNOSIS — F17211 Nicotine dependence, cigarettes, in remission: Secondary | ICD-10-CM | POA: Diagnosis not present

## 2022-08-17 DIAGNOSIS — E039 Hypothyroidism, unspecified: Secondary | ICD-10-CM | POA: Diagnosis not present

## 2022-08-17 DIAGNOSIS — Z008 Encounter for other general examination: Secondary | ICD-10-CM | POA: Diagnosis not present

## 2022-08-17 DIAGNOSIS — E785 Hyperlipidemia, unspecified: Secondary | ICD-10-CM | POA: Diagnosis not present

## 2022-08-17 DIAGNOSIS — I700 Atherosclerosis of aorta: Secondary | ICD-10-CM | POA: Diagnosis not present

## 2022-08-17 DIAGNOSIS — Z6824 Body mass index (BMI) 24.0-24.9, adult: Secondary | ICD-10-CM | POA: Diagnosis not present

## 2022-08-17 DIAGNOSIS — J439 Emphysema, unspecified: Secondary | ICD-10-CM | POA: Diagnosis not present

## 2022-08-22 ENCOUNTER — Other Ambulatory Visit: Payer: Self-pay | Admitting: Primary Care

## 2022-08-22 DIAGNOSIS — E039 Hypothyroidism, unspecified: Secondary | ICD-10-CM

## 2022-08-23 ENCOUNTER — Ambulatory Visit (INDEPENDENT_AMBULATORY_CARE_PROVIDER_SITE_OTHER)
Admission: RE | Admit: 2022-08-23 | Discharge: 2022-08-23 | Disposition: A | Discharge status: Home / Self Care | Payer: No Typology Code available for payment source | Source: Ambulatory Visit | Attending: Primary Care | Admitting: Primary Care

## 2022-08-23 ENCOUNTER — Encounter: Payer: Self-pay | Admitting: Primary Care

## 2022-08-23 ENCOUNTER — Ambulatory Visit (INDEPENDENT_AMBULATORY_CARE_PROVIDER_SITE_OTHER): Payer: No Typology Code available for payment source | Admitting: Primary Care

## 2022-08-23 VITALS — BP 110/72 | HR 85 | Temp 98.2°F | Ht 67.0 in | Wt 156.6 lb

## 2022-08-23 DIAGNOSIS — M25511 Pain in right shoulder: Secondary | ICD-10-CM | POA: Diagnosis not present

## 2022-08-23 DIAGNOSIS — Z23 Encounter for immunization: Secondary | ICD-10-CM

## 2022-08-23 MED ORDER — PREDNISONE 20 MG PO TABS: ORAL_TABLET | ORAL | 0 refills | Status: DC

## 2022-08-23 NOTE — Patient Instructions (Signed)
Start prednisone tablets for inflammation.  Take 3 tablets by mouth every morning for 3 days, then 2 tablets for 3 days, then 1 tablet for 3 days.   Do not take Advil/Ibuprofen. You may take Tylenol.  Continue stretching.  Schedule a visit with Dr. Lorelei Pont.  It was a pleasure to see you today!

## 2022-08-23 NOTE — Assessment & Plan Note (Signed)
Suspect bursitis, cannot completely rule out rotator cuff tear.   Checking plain films of the shoulder today. Start prednisone taper, 60 mg x 3 days, 40 mg x 3 days, 20 mg x 3 days.  Continue stretching. She will schedule a visit with Sports medicine. Consider physical therapy and further imaging if warranted.

## 2022-08-23 NOTE — Progress Notes (Signed)
Subjective:    Patient ID: Gloria Neal, female    DOB: 03-06-1956, 66 y.o.   MRN: [id number]  Shoulder Pain  Pertinent negatives include no numbness.    Gloria Neal is a very pleasant 66 y.o. female with a history of hypertension, hypothyroidism, BPPV, osteoarthritis, tobacco abuse, prediabetes who presents today to discuss shoulder pain.   Her pain is located to the right anterior which began about 4 weeks ago. Two days ago her right shoulder pain began to radiate down her mid humeral anterior arm after her dog tugged on the leash while she was walking.   Her pain is intermittent, occurs with rest and movement, pain is worse with movement. She was able to mow the lawn the other day, uses a riding mower. She is walking daily, sometimes experiences pain with walking. Pain is much worse with posterior abduction.   She's tried applying ice, home stretching, and taking Ibuprofen with minimal relief.   She denies overuse of her right shoulder, trauma, numbness/tingling, weakness.    Review of Systems  Musculoskeletal:  Positive for arthralgias.  Neurological:  Negative for weakness and numbness.         Past Medical History:  Diagnosis Date   Acute shoulder pain 08/07/2018   Cervical dysplasia    Heart murmur    no longer when she has younger   Hypertension    Mitral valve prolapse    Rheumatic fever    66 years old   Thyroid disease    Tobacco abuse    Vertigo     Social History   Socioeconomic History   Marital status: Married    Spouse name: Not on file   Number of children: Not on file   Years of education: Not on file   Highest education level: Not on file  Occupational History   Occupation: [employer]  Tobacco Use   Smoking status: Former    Packs/day: 1.50    Years: 44.00    Total pack years: 66.00    Types: Cigars, Cigarettes    Quit date: 03/06/2019    Years since quitting: 3.4   Smokeless tobacco: Never   Tobacco comments:    2 cigars a day   Substance and Sexual Activity   Alcohol use: Yes    Alcohol/week: 0.0 standard drinks of alcohol    Comment: occassional   Drug use: No   Sexual activity: Not on file  Other Topics Concern   Not on file  Social History Narrative   Married.   No children.   Moved from [place] last August for job.   Vacations in [place] every year.   Enjoys reading in her free time.   Social Determinants of Health   Financial Resource Strain: Not on file  Food Insecurity: Not on file  Transportation Needs: Not on file  Physical Activity: Not on file  Stress: Not on file  Social Connections: Not on file  Intimate Partner Violence: Not on file    Past Surgical History:  Procedure Laterality Date   COLONOSCOPY WITH PROPOFOL N/A 06/22/2022   Procedure: COLONOSCOPY WITH PROPOFOL;  Surgeon: Jonathon Bellows, MD;  Location: [facility] ENDOSCOPY;  Service: Gastroenterology;  Laterality: N/A;   CYSTECTOMY     right brow and buttocks   TONSILLECTOMY  1972    Family History  Problem Relation Age of Onset   Glaucoma Mother    Hypothyroidism Mother    Cancer Father        Lung CA  Arthritis Maternal Grandmother    Hypothyroidism Sister     No Known Allergies  Current Outpatient Medications on File Prior to Visit  Medication Sig Dispense Refill   atorvastatin (LIPITOR) 20 MG tablet TAKE 1 TABLET BY MOUTH EVERY EVENING FOR CHOLESTEROL 90 tablet 3   buPROPion (WELLBUTRIN SR) 150 MG 12 hr tablet Take 1 tablet (150 mg total) by mouth daily. For smoking cessation 90 tablet 2   levothyroxine (SYNTHROID) 25 MCG tablet TAKE 1 TABLET BY MOUTH EVERY MORNING, ON AN EMPTY STOMACH WITH WATER ONLY; NO FOOD OR OTHER MEDICATIONS FOR 30 MINUTES. 90 tablet 2   No current facility-administered medications on file prior to visit.    BP 110/72   Pulse 85   Temp 98.2 F (36.8 C) (Temporal)   Ht '5\' 7"'$  (1.702 m)   Wt 156 lb 9.6 oz (71 kg)   SpO2 98%   BMI 24.53 kg/m  Objective:   Physical Exam Constitutional:       General: She is not in acute distress. Pulmonary:     Effort: Pulmonary effort is normal.  Musculoskeletal:     Right shoulder: No bony tenderness. Decreased range of motion. Normal strength.     Left shoulder: Normal range of motion.     Comments: 5/5 strength to bilateral upper extremities. Positive empty can test to right upper extremity.   Skin:    General: Skin is warm and dry.           Assessment & Plan:   Problem List Items Addressed This Visit       Other   Acute pain of right shoulder - Primary    Suspect bursitis, cannot completely rule out rotator cuff tear.   Checking plain films of the shoulder today. Start prednisone taper, 60 mg x 3 days, 40 mg x 3 days, 20 mg x 3 days.  Continue stretching. She will schedule a visit with Sports medicine. Consider physical therapy and further imaging if warranted.      Relevant Medications   predniSONE (DELTASONE) 20 MG tablet   Other Relevant Orders   DG Shoulder Right       Pleas [name], NP

## 2022-08-31 ENCOUNTER — Encounter: Payer: Self-pay | Admitting: Family Medicine

## 2022-08-31 ENCOUNTER — Ambulatory Visit (INDEPENDENT_AMBULATORY_CARE_PROVIDER_SITE_OTHER): Payer: No Typology Code available for payment source | Admitting: Family Medicine

## 2022-08-31 VITALS — BP 130/72 | HR 92 | Temp 98.0°F | Ht 67.0 in | Wt 157.1 lb

## 2022-08-31 DIAGNOSIS — M7501 Adhesive capsulitis of right shoulder: Secondary | ICD-10-CM

## 2022-08-31 DIAGNOSIS — M25511 Pain in right shoulder: Secondary | ICD-10-CM

## 2022-08-31 NOTE — Progress Notes (Signed)
Hai Grabe T. Nasier Thumm, MD, [place] at [facility] [place], [address]  Phone: [phone]  FAX: [phone]  Gloria Neal - 66 y.o. female  MRN [id number]  Date of Birth: 24-Aug-1956  Date: 08/31/2022  PCP: Pleas [name], NP  Referral: Pleas [name], NP  Chief Complaint  Patient presents with   Shoulder Pain    Right   Subjective:   Gloria Neal is a 66 y.o. very pleasant female patient who presents with the following: shoulder pain  The patient noted above presents with shoulder pain that has been ongoing for 5 weeks. there is no history of trauma or accident. The patient denies neck pain or radicular symptoms. No shoulder blade pain Denies dislocation, subluxation, separation of the shoulder. The patient does complain of pain with flexion, abduction, and terminal motion.  Significant restriction of motion. she describes a deep ache around the shoulder, and sometimes it will wake the patient up at night.  Plain x-ray reviewed, and there is no significant GH OA.   Restricted motion.  She did have a frozen shoulder about 4 years ago on the same side.   Medications Tried: ibuprofen, prednisone, tylenol Ice or Heat: minimal help Tried PT: No  Prior shoulder Injury: No Prior surgery: No Prior fracture: No   Review of Systems is noted in the HPI, as appropriate  Objective:   Blood pressure 130/72, pulse 92, temperature 98 F (36.7 C), temperature source Oral, height '5\' 7"'$  (1.702 m), weight 157 lb 2 oz (71.3 kg), SpO2 96 %.   Shoulder: R and L Inspection: No muscle wasting or winging Ecchymosis/edema: neg  AC joint, scapula, clavicle: NT Cervical spine: NT, full ROM Spurling's: neg ABNORMAL SIDE TESTED: R UNLESS OTHERWISE NOTED, THE CONTRALATERAL SIDE HAS FULL RANGE OF MOTION. Abduction: 5/5, LIMITED TO 150 DEGREES Flexion: 5/5, LIMITED TO 165 DEGNO ROM  IR, lift-off: 5/5. TESTED AT 90  DEGREES OF ABDUCTION, LIMITED TO 5 DEGREES ER at neutral:  5/5, TESTED AT 90 DEGREES OF ABDUCTION, LIMITED TO 45 DEGREES AC crossover and compression: PAIN Drop Test: neg Empty Can: neg Supraspinatus insertion: NT Bicipital groove: NT ALL OTHER SPECIAL TESTING EQUIVOCAL GIVEN LOSS OF MOTION C5-T1 intact Sensation intact Grip 5/5   Assessment and Plan:     ICD-10-CM   1. Adhesive capsulitis of right shoulder  M75.01     2. Acute pain of right shoulder  M25.511      Patient was given a systematic ROM protocol from [facility] to be done daily. Emphasized importance of adherence, help of PT, daily HEP.  The average length of total symptoms is 12-18 months going through 3 different phases in the freezing and thawing process. There can be a permanent loss of motion in some cases.   Intraarticular shoulder injections discussed with patient, which have good evidence for accelerating the thawing phase.  Currently with possible improvement on oral steroids.   Patient will be sent for formal PT for aggressive frozen shoulder ROM if sx persist. Will need RTC str and scapular stabilization to fix underlying mechanics.  Social: right now she has some functional disability and decreased ability to maximally exercise with the UE.   Follow-up: 2 mo  Signed,  Johanna Matto T. Elishia Kaczorowski, MD   Outpatient Encounter Medications as of 08/31/2022  Medication Sig   atorvastatin (LIPITOR) 20 MG tablet TAKE 1 TABLET BY MOUTH EVERY EVENING FOR CHOLESTEROL   buPROPion (WELLBUTRIN SR) 150 MG 12 hr  tablet Take 1 tablet (150 mg total) by mouth daily. For smoking cessation   levothyroxine (SYNTHROID) 25 MCG tablet TAKE 1 TABLET BY MOUTH EVERY MORNING, ON AN EMPTY STOMACH WITH WATER ONLY; NO FOOD OR OTHER MEDICATIONS FOR 30 MINUTES.   predniSONE (DELTASONE) 20 MG tablet Take 3 tablets by mouth every morning for 3 days, then 2 tablets for 3 days, then 1 tablet for 3 days.   No facility-administered encounter  medications on file as of 08/31/2022.

## 2022-09-02 DIAGNOSIS — Z0100 Encounter for examination of eyes and vision without abnormal findings: Secondary | ICD-10-CM | POA: Diagnosis not present

## 2022-09-02 DIAGNOSIS — H5213 Myopia, bilateral: Secondary | ICD-10-CM | POA: Diagnosis not present

## 2022-09-13 ENCOUNTER — Telehealth: Payer: Self-pay | Admitting: Primary Care

## 2022-09-13 NOTE — Telephone Encounter (Signed)
Left message for patient to call back and schedule Medicare Annual Wellness Visit (AWV).  Pease offer to do virtually or by telephone.   No hx of AWV - eligible as of 02/02/2022  Please schedule at anytime with [facility].   45 minute appointment  Any questions, please contact me at [phone]

## 2022-10-06 ENCOUNTER — Telehealth: Payer: Self-pay | Admitting: Primary Care

## 2022-10-06 NOTE — Telephone Encounter (Signed)
Left message for patient to call back and schedule Medicare Annual Wellness Visit (AWV) in office.   Please offer to do virtually or by telephone.  Left office number and my jabber [phone].  AWVI eligible as of 02/02/22  Please schedule at anytime with Nurse Health Advisor.

## 2022-10-29 NOTE — Progress Notes (Signed)
Gloria Rodger T. Gloria Kimberlin, MD, [place] at [facility] [name], [address]  Phone: [phone]  FAX: [phone]  Gloria Neal - 66 y.o. female  MRN [id number]  Date of Birth: March 18, 1956  Date: 10/31/2022  PCP: Gloria [name], NP  Referral: Gloria [name], NP  Chief Complaint  Patient presents with   Follow-up    Right Shoulder   Subjective:   Gloria Neal is a 66 y.o. very pleasant female patient with Body mass index is 24.82 kg/m. who presents with the following:  She is a very pleasant young lady, she is here today to follow-up about a right-sided frozen shoulder.  Last time I saw her she had restriction of motion in all planes of motion, and I started her on the [facility] rehab protocol.  She is here today to follow-up:  Working on her rehab most days.  She still has some restriction of motion.   Maybe 4 months of total of symptoms.   She continues to have a deep dull ache in the shoulder, and she has restriction in motion in all planes of movement.  She has been fairly good about doing her rehab, she misses days.  She has not had any additional injury.  Inject R shoulder  Review of Systems is noted in the HPI, as appropriate  Objective:   BP 90/60   Pulse 88   Temp 98.1 F (36.7 C) (Oral)   Ht '5\' 7"'$  (1.702 m)   Wt 158 lb 8 oz (71.9 kg)   PF 97 L/min   BMI 24.82 kg/m   GEN: No acute distress; alert,appropriate. PULM: Breathing comfortably in no respiratory distress PSYCH: Normally interactive.    Shoulder: R and L Inspection: No muscle wasting or winging Ecchymosis/edema: neg  AC joint, scapula, clavicle: NT Cervical spine: NT, full ROM Spurling's: neg ABNORMAL SIDE TESTED: Right UNLESS OTHERWISE NOTED, THE CONTRALATERAL SIDE HAS FULL RANGE OF MOTION. Abduction: 5/5, LIMITED TO 150 DEGREES Flexion: 5/5, LIMITED TO 160 DEGNO ROM  IR, lift-off: 5/5. TESTED AT 90 DEGREES OF ABDUCTION,  LIMITED TO 5 DEGREES ER at neutral:  5/5, TESTED AT 90 DEGREES OF ABDUCTION, LIMITED TO 60 DEGREES AC crossover and compression: PAIN Drop Test: neg Empty Can: neg Supraspinatus insertion: NT Bicipital groove: NT ALL OTHER SPECIAL TESTING EQUIVOCAL GIVEN LOSS OF MOTION C5-T1 intact Sensation intact Grip 5/5    Laboratory and Imaging Data:  Assessment and Plan:     ICD-10-CM   1. Adhesive capsulitis of right shoulder  M75.01 triamcinolone acetonide (KENALOG-40) injection 40 mg     Frozen shoulder, she has not made much significant progress intervally.  She has been fairly compliant with doing her rehab, and I encouraged her to do this daily.  Also do an intra-articular injection today.  Intraarticular Shoulder Aspiration/Injection Procedure Note Addi Pak 1956-05-17 Date of procedure: 10/31/2022  Procedure: Large Joint Aspiration / Injection of Shoulder, Intraarticular, R Indications: Pain  Procedure Details Verbal consent was obtained from the patient. Risks explained and contrasted with benefits and alternatives. Patient prepped with Chloraprep and Ethyl Chloride used for anesthesia. An intraarticular shoulder injection was performed using the posterior approach; needle placed into joint capsule without difficulty. The patient tolerated the procedure well and had decreased pain post injection. No complications. Injection: 9 cc of Lidocaine 1% and 1 mL Kenalog 40 mg. Needle: 21 gauge, 2 inch   Medication Management during today's office visit: Meds ordered this encounter  Medications   triamcinolone acetonide (KENALOG-40) injection 40 mg   Medications Discontinued During This Encounter  Medication Reason   predniSONE (DELTASONE) 20 MG tablet Completed Course    Orders placed today for conditions managed today: No orders of the defined types were placed in this encounter.   Disposition: Return in about 10 weeks (around 01/09/2023) for follow-up frozen  shoulder.  [employer] speech-to-text software was used for transcription in this dictation.  Possible transcriptional errors can occur using Editor, commissioning.   Signed,  Gloria Neal. Gloria Sabedra, MD   Outpatient Encounter Medications as of 10/31/2022  Medication Sig   atorvastatin (LIPITOR) 20 MG tablet TAKE 1 TABLET BY MOUTH EVERY EVENING FOR CHOLESTEROL   buPROPion (WELLBUTRIN SR) 150 MG 12 hr tablet Take 1 tablet (150 mg total) by mouth daily. For smoking cessation   levothyroxine (SYNTHROID) 25 MCG tablet TAKE 1 TABLET BY MOUTH EVERY MORNING, ON AN EMPTY STOMACH WITH WATER ONLY; NO FOOD OR OTHER MEDICATIONS FOR 30 MINUTES.   [DISCONTINUED] predniSONE (DELTASONE) 20 MG tablet Take 3 tablets by mouth every morning for 3 days, then 2 tablets for 3 days, then 1 tablet for 3 days.   [EXPIRED] triamcinolone acetonide (KENALOG-40) injection 40 mg    No facility-administered encounter medications on file as of 10/31/2022.

## 2022-10-31 ENCOUNTER — Ambulatory Visit (INDEPENDENT_AMBULATORY_CARE_PROVIDER_SITE_OTHER): Payer: No Typology Code available for payment source | Admitting: Family Medicine

## 2022-10-31 ENCOUNTER — Encounter: Payer: Self-pay | Admitting: Family Medicine

## 2022-10-31 VITALS — BP 90/60 | HR 88 | Temp 98.1°F | Ht 67.0 in | Wt 158.5 lb

## 2022-10-31 DIAGNOSIS — M7501 Adhesive capsulitis of right shoulder: Secondary | ICD-10-CM | POA: Diagnosis not present

## 2022-10-31 MED ORDER — TRIAMCINOLONE ACETONIDE 40 MG/ML IJ SUSP
40.0000 mg | Freq: Once | INTRAMUSCULAR | Status: AC
  Administered 2022-10-31: 40 mg via INTRA_ARTICULAR

## 2023-01-09 ENCOUNTER — Ambulatory Visit: Payer: No Typology Code available for payment source | Admitting: Family Medicine

## 2023-02-07 ENCOUNTER — Ambulatory Visit (INDEPENDENT_AMBULATORY_CARE_PROVIDER_SITE_OTHER): Payer: No Typology Code available for payment source

## 2023-02-07 VITALS — Ht 67.0 in | Wt 154.0 lb

## 2023-02-07 DIAGNOSIS — Z0000 Encounter for general adult medical examination without abnormal findings: Secondary | ICD-10-CM | POA: Diagnosis not present

## 2023-02-07 DIAGNOSIS — Z1231 Encounter for screening mammogram for malignant neoplasm of breast: Secondary | ICD-10-CM | POA: Diagnosis not present

## 2023-02-07 NOTE — Progress Notes (Signed)
I connected with  Gloria Neal on 02/07/23 by a audio enabled telemedicine application and verified that I am speaking with the correct person using two identifiers.  Patient Location: Home  Provider Location: Office/Clinic  I discussed the limitations of evaluation and management by telemedicine. The patient expressed understanding and agreed to proceed.  Subjective:   Gloria Neal is a 67 y.o. female who presents for Medicare Annual (Subsequent) preventive examination.  Review of Systems      Cardiac Risk Factors include: advanced age (>30mn, >>56women);hypertension     Objective:    Today's Vitals   02/07/23 0834  Weight: 154 lb (69.9 kg)  Height: '5\' 7"'$  (1.702 m)   Body mass index is 24.12 kg/m.     02/07/2023    8:50 AM 06/22/2022    8:10 AM 07/26/2015    9:11 PM  Advanced Directives  Does Patient Have a Medical Advance Directive? No No No  Would patient like information on creating a medical advance directive? No - Patient declined  No - patient declined information    Current Medications (verified) Outpatient Encounter Medications as of 02/07/2023  Medication Sig   atorvastatin (LIPITOR) 20 MG tablet TAKE 1 TABLET BY MOUTH EVERY EVENING FOR CHOLESTEROL   buPROPion (WELLBUTRIN SR) 150 MG 12 hr tablet Take 1 tablet (150 mg total) by mouth daily. For smoking cessation   levothyroxine (SYNTHROID) 25 MCG tablet TAKE 1 TABLET BY MOUTH EVERY MORNING, ON AN EMPTY STOMACH WITH WATER ONLY; NO FOOD OR OTHER MEDICATIONS FOR 30 MINUTES.   No facility-administered encounter medications on file as of 02/07/2023.    Allergies (verified) Patient has no known allergies.   History: Past Medical History:  Diagnosis Date   Acute shoulder pain 08/07/2018   Cervical dysplasia    Heart murmur    no longer when she has younger   Hypertension    Mitral valve prolapse    Rheumatic fever    67years old   Thyroid disease    Tobacco abuse    Vertigo    Past Surgical History:   Procedure Laterality Date   BREAST BIOPSY Left    August 2023   COLONOSCOPY WITH PROPOFOL N/A 06/22/2022   Procedure: COLONOSCOPY WITH PROPOFOL;  Surgeon: AJonathon Bellows MD;  [facility]  Service: Gastroenterology;  Laterality: N/A;   CYSTECTOMY     right brow and buttocks   TONSILLECTOMY  12/05/1970   Family History  Problem Relation Age of Onset   Glaucoma Mother    Hypothyroidism Mother    Cancer Father        Lung CA   Arthritis Maternal Grandmother    Hypothyroidism Sister    Social History   Socioeconomic History   Marital status: Married    Spouse name: Not on file   Number of children: Not on file   Years of education: Not on file   Highest education level: Not on file  Occupational History   Occupation: Accts Payable  Tobacco Use   Smoking status: Former    Packs/day: 1.50    Years: 44.00    Total pack years: 66.00    Types: Cigars, Cigarettes    Quit date: 03/06/2019    Years since quitting: 3.9   Smokeless tobacco: Never   Tobacco comments:    2 cigars a day  Substance and Sexual Activity   Alcohol use: Yes    Alcohol/week: 0.0 standard drinks of alcohol    Comment: occassional   Drug  use: No   Sexual activity: Not on file  Other Topics Concern   Not on file  Social History Narrative   Married.   No children.   Moved from [place] last August for job.   Vacations in [place] every year.   Enjoys reading in her free time.   Social Determinants of Health   Financial Resource Strain: Not on file  Food Insecurity: No Food Insecurity (02/07/2023)   Hunger Vital Sign    Worried About Running Out of Food in the Last Year: Never true    Ran Out of Food in the Last Year: Never true  Transportation Needs: No Transportation Needs (02/07/2023)   PRAPARE - Hydrologist (Medical): No    Lack of Transportation (Non-Medical): No  Physical Activity: Sufficiently Active (02/07/2023)   Exercise Vital Sign    Days of Exercise per Week:  7 days    Minutes of Exercise per Session: 60 min  Stress: Stress Concern Present (02/07/2023)   [place]    Feeling of Stress : To some extent  Social Connections: Moderately Isolated (02/07/2023)   Social Connection and Isolation Panel [NHANES]    Frequency of Communication with Friends and Family: Three times a week    Frequency of Social Gatherings with Friends and Family: More than three times a week    Attends Religious Services: Never    Marine scientist or Organizations: No    Attends Music therapist: Never    Marital Status: Married    Tobacco Counseling Counseling given: Not Answered Tobacco comments: 2 cigars a day   Clinical Intake:  Pre-visit preparation completed: Yes  Pain : No/denies pain     Nutritional Risks: None Diabetes: No  How often do you need to have someone help you when you read instructions, pamphlets, or other written materials from your doctor or pharmacy?: 1 - Never  Diabetic? no  Interpreter Needed?: No  Information entered by :: C.Shaneese Tait LPN   Activities of Daily Living    02/07/2023    8:51 AM 06/09/2022    7:42 AM  In your present state of health, do you have any difficulty performing the following activities:  Hearing? 0 0  Vision? 0 0  Difficulty concentrating or making decisions? 0 0  Walking or climbing stairs? 0 0  Dressing or bathing? 0 0  Doing errands, shopping? 0 0  Preparing Food and eating ? N   Using the Toilet? N   In the past six months, have you accidently leaked urine? N   Do you have problems with loss of bowel control? N   Managing your Medications? N   Managing your Finances? N   Housekeeping or managing your Housekeeping? N     Patient Care Team: Pleas [name], NP as PCP - General (Nurse Practitioner)  Indicate any recent Medical Services you may have received from other than [facility] providers in the past year (date  may be approximate).     Assessment:   This is a routine wellness examination for [facility].  Hearing/Vision screen Hearing Screening - Comments:: No aids Vision Screening - Comments:: Wears glasses - [facility]  Dietary issues and exercise activities discussed: Current Exercise Habits: Home exercise routine, Type of exercise: walking, Time (Minutes): 60, Frequency (Times/Week): 7, Weekly Exercise (Minutes/Week): 420, Intensity: Mild, Exercise limited by: None identified   Goals Addressed  This Visit's Progress    Patient Stated       Keep walking, eat better, and stay healthy.       Depression Screen    02/07/2023    8:48 AM 06/09/2022    7:41 AM 06/03/2021   10:59 AM 08/07/2018    2:48 PM  PHQ 2/9 Scores  PHQ - 2 Score 0 0 0 0  PHQ- 9 Score  0 6     Fall Risk    02/07/2023    8:51 AM 06/09/2022    7:41 AM 06/03/2021   10:59 AM  Fall Risk   Falls in the past year? 0 0 0  Number falls in past yr:  0 0  Injury with Fall?  0 0  Risk for fall due to : No Fall Risks History of fall(s)   Follow up Falls evaluation completed;Falls prevention discussed      FALL RISK PREVENTION PERTAINING TO THE HOME:  Any stairs in or around the home? Yes  If so, are there any without handrails? No  Home free of loose throw rugs in walkways, pet beds, electrical cords, etc? Yes  Adequate lighting in your home to reduce risk of falls? Yes   ASSISTIVE DEVICES UTILIZED TO PREVENT FALLS:  Life alert? No  Use of a cane, walker or w/c? No  Grab bars in the bathroom? Yes  Shower chair or bench in shower? Yes  Elevated toilet seat or a handicapped toilet? No    Cognitive Function:        02/07/2023    8:52 AM  6CIT Screen  What Year? 0 points  What month? 0 points  What time? 0 points  Count back from 20 0 points  Months in reverse 0 points  Repeat phrase 0 points  Total Score 0 points    Immunizations Immunization History  Administered Date(s)  Administered   Fluad Quad(high Dose 65+) 08/23/2022   Influenza, Seasonal, Injecte, Preservative Fre 10/16/2017   Influenza,inj,Quad PF,6+ Mos 09/07/2015, 10/18/2016, 08/07/2018, 08/29/2019, 09/10/2020   PFIZER(Purple Top)SARS-COV-2 Vaccination 03/05/2020, 03/30/2020, 10/30/2020, 04/15/2021   PNEUMOCOCCAL CONJUGATE-20 11/17/2021   Pneumococcal Polysaccharide-23 04/06/2015   Td 04/25/2017   Tdap 12/05/2006   Zoster Recombinat (Shingrix) 05/07/2020, 09/10/2020   Zoster, Live 04/23/2016    TDAP status: Up to date  Flu Vaccine status: Up to date  Pneumococcal vaccine status: Up to date  Covid-19 vaccine status: Information provided on how to obtain vaccines.   Qualifies for Shingles Vaccine? No   Zostavax completed Yes   Shingrix Completed?: Yes  Screening Tests Health Maintenance  Topic Date Due   MAMMOGRAM  06/25/2023   Lung Cancer Screening  08/04/2023   Medicare Annual Wellness (AWV)  02/07/2024   DTaP/Tdap/Td (3 - Td or Tdap) 04/26/2027   COLONOSCOPY (Pts 45-18yr Insurance coverage will need to be confirmed)  06/22/2029   Pneumonia Vaccine 67 Years old  Completed   INFLUENZA VACCINE  Completed   DEXA SCAN  Completed   Hepatitis C Screening  Completed   Zoster Vaccines- Shingrix  Completed   HPV VACCINES  Aged Out   COVID-19 Vaccine  Discontinued    Health Maintenance  There are no preventive care reminders to display for this patient.   Colorectal cancer screening: Type of screening: Colonoscopy. Completed 06/22/2022. Repeat every 7 years  Mammogram status: Completed 07/13/2022. Repeat every year  Bone Density status: Completed 12/08/2021. Results reflect: Bone density results: NORMAL. Repeat every 5 years.  Lung Cancer Screening: (Low Dose  CT Chest recommended if Age 41-80 years, 30 pack-year currently smoking OR have quit w/in 15years.) does not qualify.   Lung Cancer Screening Referral: Completed 07/13/2022  Additional Screening:  Hepatitis C  Screening: does not qualify; Completed 04/26/18  Vision Screening: Recommended annual ophthalmology exams for early detection of glaucoma and other disorders of the eye. Is the patient up to date with their annual eye exam?  Yes  Who is the provider or what is the name of the office in which the patient attends annual eye exams? [facility] If pt is not established with a provider, would they like to be referred to a provider to establish care? No .   Dental Screening: Recommended annual dental exams for proper oral hygiene  Community Resource Referral / Chronic Care Management: CRR required this visit?  No   CCM required this visit?  No      Plan:     I have personally reviewed and noted the following in the patient's chart:   Medical and social history Use of alcohol, tobacco or illicit drugs  Current medications and supplements including opioid prescriptions. Patient is not currently taking opioid prescriptions. Functional ability and status Nutritional status Physical activity Advanced directives List of other physicians Hospitalizations, surgeries, and ER visits in previous 12 months Vitals Screenings to include cognitive, depression, and falls Referrals and appointments  In addition, I have reviewed and discussed with patient certain preventive protocols, quality metrics, and best practice recommendations. A written personalized care plan for preventive services as well as general preventive health recommendations were provided to patient.     Lebron Conners, LPN   [id number]   Nurse Notes: none

## 2023-02-07 NOTE — Patient Instructions (Signed)
Gloria Neal , Thank you for taking time to come for your Medicare Wellness Visit. I appreciate your ongoing commitment to your health goals. Please review the following plan we discussed and let me know if I can assist you in the future.   These are the goals we discussed:  Goals      Patient Stated     Keep walking, eat better, and stay healthy.        This is a list of the screening recommended for you and due dates:  Health Maintenance  Topic Date Due   Mammogram  06/25/2023   Screening for Lung Cancer  08/04/2023   Medicare Annual Wellness Visit  02/07/2024   DTaP/Tdap/Td vaccine (3 - Td or Tdap) 04/26/2027   Colon Cancer Screening  06/22/2029   Pneumonia Vaccine  Completed   Flu Shot  Completed   DEXA scan (bone density measurement)  Completed   Hepatitis C Screening: USPSTF Recommendation to screen - Ages 107-79 yo.  Completed   Zoster (Shingles) Vaccine  Completed   HPV Vaccine  Aged Out   COVID-19 Vaccine  Discontinued    Advanced directives: Advance directive discussed with you today. Even though you declined this today, please call our office should you change your mind, and we can give you the proper paperwork for you to fill out.   Conditions/risks identified: none  Next appointment: Follow up in one year for your annual wellness visit 02/12/2024 @ 8:45 am via telephone.   Preventive Care 74 Years and Older, Female Preventive care refers to lifestyle choices and visits with your health care provider that can promote health and wellness. What does preventive care include? A yearly physical exam. This is also called an annual well check. Dental exams once or twice a year. Routine eye exams. Ask your health care provider how often you should have your eyes checked. Personal lifestyle choices, including: Daily care of your teeth and gums. Regular physical activity. Eating a healthy diet. Avoiding tobacco and drug use. Limiting alcohol use. Practicing safe  sex. Taking low-dose aspirin every day. Taking vitamin and mineral supplements as recommended by your health care provider. What happens during an annual well check? The services and screenings done by your health care provider during your annual well check will depend on your age, overall health, lifestyle risk factors, and family history of disease. Counseling  Your health care provider may ask you questions about your: Alcohol use. Tobacco use. Drug use. Emotional well-being. Home and relationship well-being. Sexual activity. Eating habits. History of falls. Memory and ability to understand (cognition). Work and work Statistician. Reproductive health. Screening  You may have the following tests or measurements: Height, weight, and BMI. Blood pressure. Lipid and cholesterol levels. These may be checked every 5 years, or more frequently if you are over 57 years old. Skin check. Lung cancer screening. You may have this screening every year starting at age 80 if you have a 30-pack-year history of smoking and currently smoke or have quit within the past 15 years. Fecal occult blood test (FOBT) of the stool. You may have this test every year starting at age 76. Flexible sigmoidoscopy or colonoscopy. You may have a sigmoidoscopy every 5 years or a colonoscopy every 10 years starting at age 96. Hepatitis C blood test. Hepatitis B blood test. Sexually transmitted disease (STD) testing. Diabetes screening. This is done by checking your blood sugar (glucose) after you have not eaten for a while (fasting). You may have this  done every 1-3 years. Bone density scan. This is done to screen for osteoporosis. You may have this done starting at age 61. Mammogram. This may be done every 1-2 years. Talk to your health care provider about how often you should have regular mammograms. Talk with your health care provider about your test results, treatment options, and if necessary, the need for more  tests. Vaccines  Your health care provider may recommend certain vaccines, such as: Influenza vaccine. This is recommended every year. Tetanus, diphtheria, and acellular pertussis (Tdap, Td) vaccine. You may need a Td booster every 10 years. Zoster vaccine. You may need this after age 50. Pneumococcal 13-valent conjugate (PCV13) vaccine. One dose is recommended after age 38. Pneumococcal polysaccharide (PPSV23) vaccine. One dose is recommended after age 91. Talk to your health care provider about which screenings and vaccines you need and how often you need them. This information is not intended to replace advice given to you by your health care provider. Make sure you discuss any questions you have with your health care provider. Document Released: 12/18/2015 Document Revised: 08/10/2016 Document Reviewed: 09/22/2015 Elsevier Interactive Patient Education  2017 [employer] Prevention in the Home Falls can cause injuries. They can happen to people of all ages. There are many things you can do to make your home safe and to help prevent falls. What can I do on the outside of my home? Regularly fix the edges of walkways and driveways and fix any cracks. Remove anything that might make you trip as you walk through a door, such as a raised step or threshold. Trim any bushes or trees on the path to your home. Use bright outdoor lighting. Clear any walking paths of anything that might make someone trip, such as rocks or tools. Regularly check to see if handrails are loose or broken. Make sure that both sides of any steps have handrails. Any raised decks and porches should have guardrails on the edges. Have any leaves, snow, or ice cleared regularly. Use sand or salt on walking paths during winter. Clean up any spills in your garage right away. This includes oil or grease spills. What can I do in the bathroom? Use night lights. Install grab bars by the toilet and in the tub and shower.  Do not use towel bars as grab bars. Use non-skid mats or decals in the tub or shower. If you need to sit down in the shower, use a plastic, non-slip stool. Keep the floor dry. Clean up any water that spills on the floor as soon as it happens. Remove soap buildup in the tub or shower regularly. Attach bath mats securely with double-sided non-slip rug tape. Do not have throw rugs and other things on the floor that can make you trip. What can I do in the bedroom? Use night lights. Make sure that you have a light by your bed that is easy to reach. Do not use any sheets or blankets that are too big for your bed. They should not hang down onto the floor. Have a firm chair that has side arms. You can use this for support while you get dressed. Do not have throw rugs and other things on the floor that can make you trip. What can I do in the kitchen? Clean up any spills right away. Avoid walking on wet floors. Keep items that you use a lot in easy-to-reach places. If you need to reach something above you, use a strong step stool that  has a grab bar. Keep electrical cords out of the way. Do not use floor polish or wax that makes floors slippery. If you must use wax, use non-skid floor wax. Do not have throw rugs and other things on the floor that can make you trip. What can I do with my stairs? Do not leave any items on the stairs. Make sure that there are handrails on both sides of the stairs and use them. Fix handrails that are broken or loose. Make sure that handrails are as long as the stairways. Check any carpeting to make sure that it is firmly attached to the stairs. Fix any carpet that is loose or worn. Avoid having throw rugs at the top or bottom of the stairs. If you do have throw rugs, attach them to the floor with carpet tape. Make sure that you have a light switch at the top of the stairs and the bottom of the stairs. If you do not have them, ask someone to add them for you. What else  can I do to help prevent falls? Wear shoes that: Do not have high heels. Have rubber bottoms. Are comfortable and fit you well. Are closed at the toe. Do not wear sandals. If you use a stepladder: Make sure that it is fully opened. Do not climb a closed stepladder. Make sure that both sides of the stepladder are locked into place. Ask someone to hold it for you, if possible. Clearly mark and make sure that you can see: Any grab bars or handrails. First and last steps. Where the edge of each step is. Use tools that help you move around (mobility aids) if they are needed. These include: Canes. Walkers. Scooters. Crutches. Turn on the lights when you go into a dark area. Replace any light bulbs as soon as they burn out. Set up your furniture so you have a clear path. Avoid moving your furniture around. If any of your floors are uneven, fix them. If there are any pets around you, be aware of where they are. Review your medicines with your doctor. Some medicines can make you feel dizzy. This can increase your chance of falling. Ask your doctor what other things that you can do to help prevent falls. This information is not intended to replace advice given to you by your health care provider. Make sure you discuss any questions you have with your health care provider. Document Released: 09/17/2009 Document Revised: 04/28/2016 Document Reviewed: 12/26/2014 Elsevier Interactive Patient Education  2017 [employer].

## 2023-02-07 NOTE — Addendum Note (Signed)
Addended by: Lebron Conners on: 02/07/2023 11:12 AM   Modules accepted: Level of Service

## 2023-02-07 NOTE — Progress Notes (Signed)
Addendum initial AWV completed today. CPT code corrected.

## 2023-04-28 ENCOUNTER — Other Ambulatory Visit: Payer: Self-pay | Admitting: Primary Care

## 2023-04-28 DIAGNOSIS — Z720 Tobacco use: Secondary | ICD-10-CM

## 2023-04-28 NOTE — Telephone Encounter (Signed)
Patient scheduled.

## 2023-04-28 NOTE — Telephone Encounter (Signed)
Patient is due for CPE/follow up in July, this will be required prior to any further refills.  Please schedule, thank you!   

## 2023-04-28 NOTE — Telephone Encounter (Signed)
Called home number, was not able to reach patient but asked person who answered to have patient call back.

## 2023-05-13 ENCOUNTER — Other Ambulatory Visit: Payer: Self-pay | Admitting: Primary Care

## 2023-05-13 DIAGNOSIS — E039 Hypothyroidism, unspecified: Secondary | ICD-10-CM

## 2023-05-18 ENCOUNTER — Other Ambulatory Visit: Payer: Self-pay | Admitting: Acute Care

## 2023-05-18 DIAGNOSIS — Z122 Encounter for screening for malignant neoplasm of respiratory organs: Secondary | ICD-10-CM

## 2023-05-18 DIAGNOSIS — Z87891 Personal history of nicotine dependence: Secondary | ICD-10-CM

## 2023-06-14 ENCOUNTER — Encounter: Payer: Self-pay | Admitting: Primary Care

## 2023-06-14 ENCOUNTER — Ambulatory Visit: Payer: No Typology Code available for payment source | Admitting: Primary Care

## 2023-06-14 VITALS — BP 128/82 | HR 79 | Temp 97.3°F | Ht 67.0 in | Wt 161.0 lb

## 2023-06-14 DIAGNOSIS — I10 Essential (primary) hypertension: Secondary | ICD-10-CM

## 2023-06-14 DIAGNOSIS — Z720 Tobacco use: Secondary | ICD-10-CM

## 2023-06-14 DIAGNOSIS — G479 Sleep disorder, unspecified: Secondary | ICD-10-CM

## 2023-06-14 DIAGNOSIS — E785 Hyperlipidemia, unspecified: Secondary | ICD-10-CM

## 2023-06-14 DIAGNOSIS — Z0000 Encounter for general adult medical examination without abnormal findings: Secondary | ICD-10-CM | POA: Diagnosis not present

## 2023-06-14 DIAGNOSIS — M1991 Primary osteoarthritis, unspecified site: Secondary | ICD-10-CM

## 2023-06-14 DIAGNOSIS — E039 Hypothyroidism, unspecified: Secondary | ICD-10-CM | POA: Diagnosis not present

## 2023-06-14 DIAGNOSIS — R7303 Prediabetes: Secondary | ICD-10-CM

## 2023-06-14 LAB — COMPREHENSIVE METABOLIC PANEL
ALT: 22 U/L (ref 0–35)
AST: 19 U/L (ref 0–37)
Albumin: 4 g/dL (ref 3.5–5.2)
Alkaline Phosphatase: 78 U/L (ref 39–117)
BUN: 16 mg/dL (ref 6–23)
CO2: 28 mEq/L (ref 19–32)
Calcium: 9.3 mg/dL (ref 8.4–10.5)
Chloride: 108 mEq/L (ref 96–112)
Creatinine, Ser: 0.83 mg/dL (ref 0.40–1.20)
GFR: 72.99 mL/min (ref >60.00)
Glucose, Bld: 100 mg/dL — ABNORMAL HIGH (ref 70–99)
Potassium: 5 mEq/L (ref 3.5–5.1)
Sodium: 142 mEq/L (ref 135–145)
Total Bilirubin: 0.5 mg/dL (ref 0.2–1.2)
Total Protein: 6.2 g/dL (ref 6.0–8.3)

## 2023-06-14 LAB — TSH: TSH: 1.29 u[IU]/mL (ref 0.35–5.50)

## 2023-06-14 LAB — LIPID PANEL
Cholesterol: 140 mg/dL (ref 0–200)
HDL: 59.7 mg/dL (ref >39.00)
LDL Cholesterol: 65 mg/dL (ref 0–99)
NonHDL: 80.61
Total CHOL/HDL Ratio: 2
Triglycerides: 80 mg/dL (ref 0.0–149.0)
VLDL: 16 mg/dL (ref 0.0–40.0)

## 2023-06-14 LAB — URIC ACID: Uric Acid, Serum: 5.5 mg/dL (ref 2.4–7.0)

## 2023-06-14 LAB — HEMOGLOBIN A1C: Hgb A1c MFr Bld: 5.9 % (ref 4.6–6.5)

## 2023-06-14 NOTE — Assessment & Plan Note (Addendum)
Stable.  Completed sleep study in 2016 which was negative. Continue to monitor.   Consider repeating sleep study. She will update.

## 2023-06-14 NOTE — Patient Instructions (Signed)
Stop by the lab prior to leaving today. I will notify you of your results once received.   It was a pleasure to see you today!  

## 2023-06-14 NOTE — Progress Notes (Signed)
Subjective:    Patient ID: Gloria Neal, female    DOB: 07-28-1956, 67 y.o.   MRN: [id number]  HPI  Gloria Neal is a very pleasant 67 y.o. female who presents today for complete physical and follow up of chronic conditions.   Immunizations: -Tetanus: Completed in 2018 -Shingles: Completed Shingrix series -Pneumonia: Completed Prevnar 20 in 2022, Pneumovax 23 in 2016  Diet: Fair diet.  Exercise: Walking 3-4 miles   Eye exam: Completes annually  Dental exam: Completes every 4 months   Mammogram: Scheduled for 06/26/2023, completed last in August 2023 Bone Density Scan: January 2023  Colonoscopy: Completed in 2023, due 2030 Lung Cancer Screening: Completed in August 2023, scheduled for September 2024  BP Readings from Last 3 Encounters:  06/14/23 128/82  10/31/22 90/60  08/31/22 130/72       Review of Systems  Constitutional:  Negative for unexpected weight change.  HENT:  Negative for rhinorrhea.   Respiratory:  Negative for cough and shortness of breath.   Cardiovascular:  Negative for chest pain.  Gastrointestinal:  Negative for constipation and diarrhea.  Genitourinary:  Negative for difficulty urinating.  Musculoskeletal:  Positive for arthralgias.  Skin:  Negative for rash.  Allergic/Immunologic: Negative for environmental allergies.  Neurological:  Negative for dizziness, numbness and headaches.  Psychiatric/Behavioral:  The patient is not nervous/anxious.          Past Medical History:  Diagnosis Date   Acute shoulder pain 08/07/2018   Cervical dysplasia    Heart murmur    no longer when she has younger   Hypertension    Mitral valve prolapse    Rheumatic fever    67 years old   Thyroid disease    Tobacco abuse    Vertigo     Social History   Socioeconomic History   Marital status: Married    Spouse name: Not on file   Number of children: Not on file   Years of education: Not on file   Highest education level: Not on file   Occupational History   Occupation: Accts Payable  Tobacco Use   Smoking status: Former    Packs/day: 1.50    Years: 44.00    Additional pack years: 0.00    Total pack years: 66.00    Types: Cigarettes, Cigars    Quit date: 03/06/2019    Years since quitting: 4.2   Smokeless tobacco: Never   Tobacco comments:    2 cigars a day  Substance and Sexual Activity   Alcohol use: Not Currently    Comment: occassional   Drug use: No   Sexual activity: Not on file  Other Topics Concern   Not on file  Social History Narrative   Married.   No children.   Moved from [place] last August for job.   Vacations in [place] every year.   Enjoys reading in her free time.   Social Determinants of Health   Financial Resource Strain: Not on file  Food Insecurity: No Food Insecurity (02/07/2023)   Hunger Vital Sign    Worried About Running Out of Food in the Last Year: Never true    Ran Out of Food in the Last Year: Never true  Transportation Needs: No Transportation Needs (02/07/2023)   PRAPARE - Administrator, Civil Service (Medical): No    Lack of Transportation (Non-Medical): No  Physical Activity: Sufficiently Active (02/07/2023)   Exercise Vital Sign    Days of Exercise per Week: 7 days  Minutes of Exercise per Session: 60 min  Stress: Stress Concern Present (02/07/2023)   [employer] of Occupational Health - Occupational Stress Questionnaire    Feeling of Stress : To some extent  Social Connections: Moderately Isolated (02/07/2023)   Social Connection and Isolation Panel [NHANES]    Frequency of Communication with Friends and Family: Three times a week    Frequency of Social Gatherings with Friends and Family: More than three times a week    Attends Religious Services: Never    Database administrator or Organizations: No    Attends Banker Meetings: Never    Marital Status: Married  Catering manager Violence: Not At Risk (02/07/2023)   Humiliation, Afraid, Rape, and  Kick questionnaire    Fear of Current or Ex-Partner: No    Emotionally Abused: No    Physically Abused: No    Sexually Abused: No    Past Surgical History:  Procedure Laterality Date   BREAST BIOPSY Left    August 2023   COLONOSCOPY WITH PROPOFOL N/A 06/22/2022   Procedure: COLONOSCOPY WITH PROPOFOL;  Surgeon: Wyline Mood, MD;  Location: [facility] ENDOSCOPY;  Service: Gastroenterology;  Laterality: N/A;   CYSTECTOMY     right brow and buttocks   TONSILLECTOMY  12/05/1970    Family History  Problem Relation Age of Onset   Glaucoma Mother    Hypothyroidism Mother    Cancer Father        Lung CA   Arthritis Maternal Grandmother    Hypothyroidism Sister    Cancer Sister     No Known Allergies  Current Outpatient Medications on File Prior to Visit  Medication Sig Dispense Refill   atorvastatin (LIPITOR) 20 MG tablet TAKE 1 TABLET BY MOUTH EVERY EVENING FOR CHOLESTEROL 90 tablet 3   buPROPion (WELLBUTRIN SR) 150 MG 12 hr tablet TAKE 1 TABLET (150 MG TOTAL) BY MOUTH DAILY. FOR SMOKING CESSATION 90 tablet 0   calcium-vitamin D (OSCAL WITH D) 500-5 MG-MCG tablet Take 1 tablet by mouth.     levothyroxine (SYNTHROID) 25 MCG tablet TAKE 1 TABLET BY MOUTH EVERY DAY IN THE MORNING EMPTY STONACH NO FOOD 30 MINUTES 90 tablet 0   Omega-3 Fatty Acids (FISH OIL PO) Take by mouth.     No current facility-administered medications on file prior to visit.    BP 128/82   Pulse 79   Temp (!) 97.3 F (36.3 C) (Temporal)   Ht 5\' 7"  (1.702 m)   Wt 161 lb (73 kg)   SpO2 96%   BMI 25.22 kg/m  Objective:   Physical Exam HENT:     Right Ear: Tympanic membrane and ear canal normal.     Left Ear: Tympanic membrane and ear canal normal.     Nose: Nose normal.  Eyes:     Conjunctiva/sclera: Conjunctivae normal.     Pupils: Pupils are equal, round, and reactive to light.  Neck:     Thyroid: No thyromegaly.  Cardiovascular:     Rate and Rhythm: Normal rate and regular rhythm.     Heart sounds:  No murmur heard. Pulmonary:     Effort: Pulmonary effort is normal.     Breath sounds: Normal breath sounds. No rales.  Abdominal:     General: Bowel sounds are normal.     Palpations: Abdomen is soft.     Tenderness: There is no abdominal tenderness.  Musculoskeletal:        General: Normal range of motion.  Cervical back: Neck supple.  Lymphadenopathy:     Cervical: No cervical adenopathy.  Skin:    General: Skin is warm and dry.     Findings: No rash.  Neurological:     Mental Status: She is alert and oriented to person, place, and time.     Cranial Nerves: No cranial nerve deficit.     Deep Tendon Reflexes: Reflexes are normal and symmetric.  Psychiatric:        Mood and Affect: Mood normal.           Assessment & Plan:  Preventative health care Assessment & Plan: Immunizations UTD. Mammogram scheduled Colonoscopy UTD, due 2030 Bone density scan UTD Lung cancer screening scheduled   Discussed the importance of a healthy diet and regular exercise in order for weight loss, and to reduce the risk of further co-morbidity.  Exam stable. Labs pending.  Follow up in 1 year for repeat physical.    Essential hypertension Assessment & Plan: Controlled.  Continue without treatment. Continue to monitor.   Orders: -     Comprehensive metabolic panel  Hypothyroidism, unspecified type Assessment & Plan: She is taking levothyroxine correctly. Repeat TSH pending.  Continue levothyroxine 25 mcg daily.  Orders: -     TSH  Primary osteoarthritis, unspecified site Assessment & Plan: Waxes and wanes.  No concerns today. Continue to monitor.    Orders: -     Uric acid  Hyperlipidemia, unspecified hyperlipidemia type Assessment & Plan: Repeat lipid pending.  Continue atorvastatin 20 mg daily.  Orders: -     Lipid panel  Prediabetes Assessment & Plan: Commended her on regular exercise.  Repeat A1C pending.  Orders: -     Hemoglobin  A1c  Sleep disorder Assessment & Plan: Stable.  Completed sleep study in 2016 which was negative. Continue to monitor.   Consider repeating sleep study. She will update.    Tobacco abuse Assessment & Plan: Complete cessation. Lung cancer screening UTD.  Continue Wellbutrin SR 150 mg daily.         Doreene Nest, NP

## 2023-06-14 NOTE — Assessment & Plan Note (Signed)
Controlled.  Continue without treatment. Continue to monitor.

## 2023-06-14 NOTE — Assessment & Plan Note (Signed)
Repeat lipid pending.  Continue atorvastatin 20 mg daily.

## 2023-06-14 NOTE — Assessment & Plan Note (Signed)
Waxes and wanes.  No concerns today. Continue to monitor.  

## 2023-06-14 NOTE — Assessment & Plan Note (Signed)
She is taking levothyroxine correctly. Repeat TSH pending.  Continue levothyroxine 25 mcg daily. 

## 2023-06-14 NOTE — Assessment & Plan Note (Signed)
Immunizations UTD. Mammogram scheduled Colonoscopy UTD, due 2030 Bone density scan UTD Lung cancer screening scheduled   Discussed the importance of a healthy diet and regular exercise in order for weight loss, and to reduce the risk of further co-morbidity.  Exam stable. Labs pending.  Follow up in 1 year for repeat physical.

## 2023-06-14 NOTE — Assessment & Plan Note (Addendum)
Complete cessation. Lung cancer screening UTD.  Continue Wellbutrin SR 150 mg daily.

## 2023-06-14 NOTE — Assessment & Plan Note (Signed)
Commended her on regular exercise.  Repeat A1C pending. 

## 2023-06-26 ENCOUNTER — Ambulatory Visit
Admission: RE | Admit: 2023-06-26 | Discharge: 2023-06-26 | Disposition: A | Discharge status: Home / Self Care | Payer: No Typology Code available for payment source | Source: Ambulatory Visit | Attending: Primary Care | Admitting: Primary Care

## 2023-06-26 DIAGNOSIS — Z1231 Encounter for screening mammogram for malignant neoplasm of breast: Secondary | ICD-10-CM | POA: Insufficient documentation

## 2023-07-24 ENCOUNTER — Other Ambulatory Visit: Payer: Self-pay | Admitting: Primary Care

## 2023-07-24 DIAGNOSIS — Z720 Tobacco use: Secondary | ICD-10-CM

## 2023-07-28 ENCOUNTER — Encounter: Payer: Self-pay | Admitting: Family Medicine

## 2023-07-28 ENCOUNTER — Ambulatory Visit (INDEPENDENT_AMBULATORY_CARE_PROVIDER_SITE_OTHER): Payer: No Typology Code available for payment source | Admitting: Family Medicine

## 2023-07-28 VITALS — BP 130/80 | HR 89 | Temp 98.4°F | Ht 67.0 in | Wt 161.5 lb

## 2023-07-28 DIAGNOSIS — R310 Gross hematuria: Secondary | ICD-10-CM

## 2023-07-28 DIAGNOSIS — R350 Frequency of micturition: Secondary | ICD-10-CM | POA: Diagnosis not present

## 2023-07-28 LAB — POC URINALSYSI DIPSTICK (AUTOMATED)
Bilirubin, UA: NEGATIVE
Glucose, UA: NEGATIVE
Ketones, UA: NEGATIVE
Leukocytes, UA: NEGATIVE
Nitrite, UA: NEGATIVE
Protein, UA: NEGATIVE
Spec Grav, UA: <=1.005 — AB (ref 1.010–1.025)
Urobilinogen, UA: 0.2 E.U./dL
pH, UA: 6.5 (ref 5.0–8.0)

## 2023-07-28 NOTE — Assessment & Plan Note (Addendum)
Acute, not typical symptoms of kidney stone, no kidney stone history. Urinalysis positive for blood but without leukocyte Estrace and nitrates.  This makes urinary tract infection possible as cause of hematuria but less likely.   Increase water intake. Will send urine for culture. Vaginal exam revealed no bleeding from vaginal or uterine source. On differential of painless hematuria and smoker is alternative bladder sources of blood including bladder cancer. If urine culture returns negative we will move forward with urology referral.

## 2023-07-28 NOTE — Progress Notes (Signed)
Patient ID: Gloria Neal, female    DOB: 10/31/1956, 67 y.o.   MRN: [id number]  This visit was conducted in person.  BP 130/80 (BP Location: Right Arm, Patient Position: Sitting, Cuff Size: Normal)   Pulse 89   Temp 98.4 F (36.9 C) (Temporal)   Ht 5\' 7"  (1.702 m)   Wt 161 lb 8 oz (73.3 kg)   SpO2 96%   BMI 25.29 kg/m    CC:  Chief Complaint  Patient presents with   Urinary Frequency   Hematuria   Back Pain    Subjective:   HPI: Gloria Neal is a 67 y.o. female presenting on 07/28/2023 for Urinary Frequency, Hematuria, and Back Pain  Hematuria This is a new problem. The current episode started yesterday. The problem has been gradually worsening since onset. She describes the hematuria as gross hematuria. The hematuria occurs throughout her entire urinary stream. Her pain is at a severity of 1/10. The pain is mild. She describes her urine color as dark red. Irritative symptoms include frequency. Obstructive symptoms do not include dribbling, a slower stream or a weak stream. Associated symptoms include abdominal pain, bladder pain and nausea. Pertinent negatives include no dysuria, fever, flank pain or vomiting. (Low back pain) She is sexually active. Risk factors include smoking/tobacco exposure (former smoker, not on blood thinner).           Relevant past medical, surgical, family and social history reviewed and updated as indicated. Interim medical history since our last visit reviewed. Allergies and medications reviewed and updated. Outpatient Medications Prior to Visit  Medication Sig Dispense Refill   atorvastatin (LIPITOR) 20 MG tablet TAKE 1 TABLET BY MOUTH EVERY EVENING FOR CHOLESTEROL 90 tablet 3   buPROPion (WELLBUTRIN SR) 150 MG 12 hr tablet TAKE 1 TABLET (150 MG TOTAL) BY MOUTH DAILY. FOR SMOKING CESSATION 90 tablet 2   calcium-vitamin D (OSCAL WITH D) 500-5 MG-MCG tablet Take 1 tablet by mouth.     levothyroxine (SYNTHROID) 25 MCG tablet TAKE 1 TABLET BY  MOUTH EVERY DAY IN THE MORNING EMPTY STONACH NO FOOD 30 MINUTES 90 tablet 0   Omega-3 Fatty Acids (FISH OIL PO) Take by mouth.     No facility-administered medications prior to visit.     Per HPI unless specifically indicated in ROS section below Review of Systems  Constitutional:  Negative for fever.  Gastrointestinal:  Positive for abdominal pain and nausea. Negative for vomiting.  Genitourinary:  Positive for frequency. Negative for dysuria and flank pain.   Objective:  BP 130/80 (BP Location: Right Arm, Patient Position: Sitting, Cuff Size: Normal)   Pulse 89   Temp 98.4 F (36.9 C) (Temporal)   Ht 5\' 7"  (1.702 m)   Wt 161 lb 8 oz (73.3 kg)   SpO2 96%   BMI 25.29 kg/m   Wt Readings from Last 3 Encounters:  07/28/23 161 lb 8 oz (73.3 kg)  06/14/23 161 lb (73 kg)  02/07/23 154 lb (69.9 kg)      Physical Exam Exam conducted with a chaperone present.  Constitutional:      General: She is not in acute distress.    Appearance: Normal appearance. She is well-developed. She is not ill-appearing or toxic-appearing.  HENT:     Head: Normocephalic.     Right Ear: Hearing, tympanic membrane, ear canal and external ear normal. Tympanic membrane is not erythematous, retracted or bulging.     Left Ear: Hearing, tympanic membrane, ear canal and external ear  normal. Tympanic membrane is not erythematous, retracted or bulging.     Nose: No mucosal edema or rhinorrhea.     Right Sinus: No maxillary sinus tenderness or frontal sinus tenderness.     Left Sinus: No maxillary sinus tenderness or frontal sinus tenderness.     Mouth/Throat:     Mouth: Oropharynx is clear and moist and mucous membranes are normal.     Pharynx: Uvula midline.  Eyes:     General: Lids are normal. Lids are everted, no foreign bodies appreciated.     Extraocular Movements: EOM normal.     Conjunctiva/sclera: Conjunctivae normal.     Pupils: Pupils are equal, round, and reactive to light.  Neck:     Thyroid: No  thyroid mass or thyromegaly.     Vascular: No carotid bruit.     Trachea: Trachea normal.  Cardiovascular:     Rate and Rhythm: Normal rate and regular rhythm.     Pulses: Normal pulses.     Heart sounds: Normal heart sounds, S1 normal and S2 normal. No murmur heard.    No friction rub. No gallop.  Pulmonary:     Effort: Pulmonary effort is normal. No tachypnea or respiratory distress.     Breath sounds: Normal breath sounds. No decreased breath sounds, wheezing, rhonchi or rales.  Abdominal:     General: Bowel sounds are normal.     Palpations: Abdomen is soft.     Tenderness: There is no abdominal tenderness.     Hernia: There is no hernia in the left inguinal area or right inguinal area.  Genitourinary:    Exam position: Lithotomy position.     Labia:        Right: No rash or injury.        Left: No rash or injury.      Urethra: No prolapse or urethral lesion.     Vagina: Normal. No tenderness or bleeding.     Cervix: Normal. No erythema or cervical bleeding.     Uterus: Normal.      Adnexa: Right adnexa normal and left adnexa normal.  Musculoskeletal:     Cervical back: Normal range of motion and neck supple.     Lumbar back: Tenderness present. No bony tenderness. Normal range of motion. Negative right straight leg raise test and negative left straight leg raise test.  Skin:    General: Skin is warm, dry and intact.     Findings: No rash.  Neurological:     Mental Status: She is alert.  Psychiatric:        Mood and Affect: Mood is not anxious or depressed.        Speech: Speech normal.        Behavior: Behavior normal. Behavior is cooperative.        Thought Content: Thought content normal.        Cognition and Memory: Cognition and memory normal.        Judgment: Judgment normal.       Results for orders placed or performed in visit on 07/28/23  POCT Urinalysis Dipstick (Automated)  Result Value Ref Range   Color, UA Yellow    Clarity, UA Clear    Glucose, UA  Negative Negative   Bilirubin, UA Negative    Ketones, UA Negative    Spec Grav, UA <=1.005 (A) 1.010 - 1.025   Blood, UA Large (3+)    pH, UA 6.5 5.0 - 8.0   Protein, UA Negative Negative  Urobilinogen, UA 0.2 0.2 or 1.0 E.U./dL   Nitrite, UA Negative    Leukocytes, UA Negative Negative    Assessment and Plan  Urinary frequency -     POCT Urinalysis Dipstick (Automated) -     Urine Culture  Gross hematuria Assessment & Plan: Acute, not typical symptoms of kidney stone, no kidney stone history. Urinalysis positive for blood but without leukocyte Estrace and nitrates.  This makes urinary tract infection possible as cause of hematuria but less likely.   Increase water intake. Will send urine for culture. Vaginal exam revealed no bleeding from vaginal or uterine source. On differential of painless hematuria and smoker is alternative bladder sources of blood including bladder cancer. If urine culture returns negative we will move forward with urology referral.  Orders: -     Urine Culture    No follow-ups on file.   Kerby Nora, MD

## 2023-07-30 LAB — URINE CULTURE
MICRO NUMBER:: 15374003
SPECIMEN QUALITY:: ADEQUATE

## 2023-07-31 ENCOUNTER — Encounter: Payer: Self-pay | Admitting: Family Medicine

## 2023-08-01 ENCOUNTER — Other Ambulatory Visit: Payer: Self-pay | Admitting: Family Medicine

## 2023-08-01 MED ORDER — CEPHALEXIN 500 MG PO CAPS: 500.0000 mg | ORAL_CAPSULE | Freq: Three times a day (TID) | ORAL | 0 refills | Status: DC

## 2023-08-01 NOTE — Addendum Note (Signed)
Addended by: Damita Lack on: 08/01/2023 08:51 AM   Modules accepted: Orders

## 2023-08-08 ENCOUNTER — Ambulatory Visit
Admission: RE | Admit: 2023-08-08 | Discharge: 2023-08-08 | Disposition: A | Discharge status: Home / Self Care | Payer: No Typology Code available for payment source | Source: Ambulatory Visit | Attending: Primary Care | Admitting: Primary Care

## 2023-08-08 DIAGNOSIS — Z122 Encounter for screening for malignant neoplasm of respiratory organs: Secondary | ICD-10-CM | POA: Diagnosis not present

## 2023-08-08 DIAGNOSIS — Z87891 Personal history of nicotine dependence: Secondary | ICD-10-CM | POA: Diagnosis not present

## 2023-08-13 ENCOUNTER — Other Ambulatory Visit: Payer: Self-pay | Admitting: Primary Care

## 2023-08-13 DIAGNOSIS — E039 Hypothyroidism, unspecified: Secondary | ICD-10-CM

## 2023-08-18 ENCOUNTER — Other Ambulatory Visit: Payer: Self-pay | Admitting: Acute Care

## 2023-08-18 DIAGNOSIS — Z87891 Personal history of nicotine dependence: Secondary | ICD-10-CM

## 2023-08-18 DIAGNOSIS — Z122 Encounter for screening for malignant neoplasm of respiratory organs: Secondary | ICD-10-CM

## 2023-09-04 ENCOUNTER — Other Ambulatory Visit: Payer: Self-pay | Admitting: Primary Care

## 2023-09-04 DIAGNOSIS — H5213 Myopia, bilateral: Secondary | ICD-10-CM | POA: Diagnosis not present

## 2023-09-04 DIAGNOSIS — Z0100 Encounter for examination of eyes and vision without abnormal findings: Secondary | ICD-10-CM | POA: Diagnosis not present

## 2023-09-04 NOTE — Telephone Encounter (Signed)
Error

## 2023-09-10 ENCOUNTER — Other Ambulatory Visit: Payer: Self-pay | Admitting: Primary Care

## 2023-09-10 DIAGNOSIS — E785 Hyperlipidemia, unspecified: Secondary | ICD-10-CM

## 2023-11-08 DIAGNOSIS — E785 Hyperlipidemia, unspecified: Secondary | ICD-10-CM | POA: Diagnosis not present

## 2023-11-08 DIAGNOSIS — J439 Emphysema, unspecified: Secondary | ICD-10-CM | POA: Diagnosis not present

## 2023-11-08 DIAGNOSIS — I700 Atherosclerosis of aorta: Secondary | ICD-10-CM | POA: Diagnosis not present

## 2023-11-08 DIAGNOSIS — R7303 Prediabetes: Secondary | ICD-10-CM | POA: Diagnosis not present

## 2023-11-08 DIAGNOSIS — Z008 Encounter for other general examination: Secondary | ICD-10-CM | POA: Diagnosis not present

## 2023-11-08 DIAGNOSIS — E663 Overweight: Secondary | ICD-10-CM | POA: Diagnosis not present

## 2023-11-08 DIAGNOSIS — E039 Hypothyroidism, unspecified: Secondary | ICD-10-CM | POA: Diagnosis not present

## 2023-11-08 DIAGNOSIS — Z6825 Body mass index (BMI) 25.0-25.9, adult: Secondary | ICD-10-CM | POA: Diagnosis not present

## 2024-01-11 ENCOUNTER — Ambulatory Visit (INDEPENDENT_AMBULATORY_CARE_PROVIDER_SITE_OTHER): Payer: No Typology Code available for payment source | Admitting: Family Medicine

## 2024-01-11 VITALS — BP 92/68 | HR 96 | Temp 98.6°F | Ht 67.0 in | Wt 169.5 lb

## 2024-01-11 DIAGNOSIS — M542 Cervicalgia: Secondary | ICD-10-CM

## 2024-01-11 DIAGNOSIS — M79602 Pain in left arm: Secondary | ICD-10-CM

## 2024-01-11 MED ORDER — CELECOXIB 200 MG PO CAPS: 200.0000 mg | ORAL_CAPSULE | Freq: Every day | ORAL | 1 refills | Status: DC

## 2024-01-11 NOTE — Progress Notes (Signed)
 [name], MD, CAQ Sports Medicine [facility] [address] [place], [address]  Phone: [phone]  FAX: [phone]  Gloria Neal - 68 y.o. female  MRN [id number]  Date of Birth: 1956/11/19  Date: 01/11/2024  PCP: Gretta Comer POUR, NP  Referral: Gretta Comer POUR, NP  Chief Complaint  Patient presents with   Arm Pain    Left x 3 weeks   Subjective:   Gloria Neal is a 68 y.o. very pleasant female patient with Body mass index is 26.55 kg/m. who presents with the following:  Gloria Neal is a very pleasant 68 year old lady who I recall previously, when I saw her for some right-sided adhesive capsulitis.  About 3 weeks ago, she started to get some pain in the left arm, and her pain has varied a bit over time and felt as if it is in the shoulder, shoulder blade, neck, elbow and further down in the arm.  She does not think that she had any kind of specific injury.  Only thing she could possibly think of was that she has a dog that pulls quite a bit when she is walking it, however she did not have any acute event.  She has had some intermittent neck pain off and on for an extended period of time.  She wonders if an extended amount of screen time on the phone, iPad, and on a [employer] device could be placing some extra strain in the posterior neck in the parascapular region. Iphone, ipad, reading  Heat, helps a little Motrin at night, 600 mg  She does not have any numbness, tingling, or focal weakness in the neck or shoulder.  Review of Systems is noted in the HPI, as appropriate  Objective:   BP 92/68 (BP Location: Right Arm, Patient Position: Sitting, Cuff Size: Large)   Pulse 96   Temp 98.6 F (37 C) (Temporal)   Ht 5' 7 (1.702 m)   Wt 169 lb 8 oz (76.9 kg)   SpO2 96%   BMI 26.55 kg/m   GEN: No acute distress; alert,appropriate. PULM: Breathing comfortably in no respiratory distress PSYCH: Normally interactive.     CERVICAL SPINE EXAM Range of motion: Flexion, extension, lateral bending, and rotation: She has a roughly 20% decrease in forward flexion and to a lesser extent and lateral bending with normal rotational movement Spurling's: Negative Pain with terminal motion: Mild Spinous Processes: NT SCM: NT Upper paracervical muscles: She does have some tenderness in the posterior paracervical muscular near the occiput and more on the left side Upper traps: She also has some pain in the left trapezius region C5-T1 intact, sensation and motor    Shoulder: L Inspection: No muscle wasting or winging Ecchymosis/edema: neg  AC joint, scapula, clavicle: NT Abduction: full, 5/5 Flexion: full, 5/5 IR, full, lift-off: 5/5 ER at neutral: full, 5/5 AC crossover and compression: neg Neer: neg Hawkins: neg Drop Test: neg Jobe: neg Supraspinatus insertion: NT Bicipital groove: NT Speed's: neg Yergason's: neg Sulcus sign: neg Scapular dyskinesis: none  Left elbow has full motion and no tenderness at the medial or lateral epicondyles or pain at the olecranon or any other location in the elbow.  Laboratory and Imaging Data:  Assessment and Plan:     ICD-10-CM   1. Cervicalgia  M54.2     2. Left arm pain  M79.602      I think that this is all neck pain and referred pain from the neck.  Do not suspect intra-articular shoulder or elbow pathology.  I gave her some McKenzie protocol style rehab from [facility] as well as some additional motion and strengthening.  Think doing this alone along with some scheduled anti-inflammatories over the next few weeks would likely be all that she needs to recover.  Medication Management during today's office visit: Meds ordered this encounter  Medications   celecoxib  (CELEBREX ) 200 MG capsule    Sig: Take 1 capsule (200 mg total) by mouth daily.    Dispense:  30 capsule    Refill:  1   Medications Discontinued During This Encounter  Medication Reason    cephALEXin  (KEFLEX ) 500 MG capsule Completed Course    Orders placed today for conditions managed today: No orders of the defined types were placed in this encounter.   Disposition: No follow-ups on file.  [employer] One speech-to-text software was used for transcription in this dictation.  Possible transcriptional errors can occur using Animal nutritionist.   Signed,  Jacques DASEN. Jassica Zazueta, MD   Outpatient Encounter Medications as of 01/11/2024  Medication Sig   atorvastatin  (LIPITOR) 20 MG tablet TAKE 1 TABLET BY MOUTH EVERY DAY IN THE EVENING FOR CHOLESTEROL   buPROPion  (WELLBUTRIN  SR) 150 MG 12 hr tablet TAKE 1 TABLET (150 MG TOTAL) BY MOUTH DAILY. FOR SMOKING CESSATION   calcium -vitamin D  (OSCAL WITH D) 500-5 MG-MCG tablet Take 1 tablet by mouth.   celecoxib  (CELEBREX ) 200 MG capsule Take 1 capsule (200 mg total) by mouth daily.   levothyroxine  (SYNTHROID ) 25 MCG tablet TAKE 1 TABLET BY MOUTH EVERY DAY IN THE MORNING EMPTY STONACH NO FOOD 30 MINUTES   Omega-3 Fatty Acids (FISH OIL PO) Take by mouth.   [DISCONTINUED] cephALEXin  (KEFLEX ) 500 MG capsule Take 1 capsule (500 mg total) by mouth 3 (three) times daily.   No facility-administered encounter medications on file as of 01/11/2024.

## 2024-01-12 ENCOUNTER — Encounter: Payer: Self-pay | Admitting: Family Medicine

## 2024-02-12 ENCOUNTER — Ambulatory Visit (INDEPENDENT_AMBULATORY_CARE_PROVIDER_SITE_OTHER): Payer: No Typology Code available for payment source

## 2024-02-12 VITALS — Ht 67.0 in | Wt 164.0 lb

## 2024-02-12 DIAGNOSIS — Z0000 Encounter for general adult medical examination without abnormal findings: Secondary | ICD-10-CM

## 2024-02-12 NOTE — Patient Instructions (Signed)
 Gloria Neal , Thank you for taking time to come for your Medicare Wellness Visit. I appreciate your ongoing commitment to your health goals. Please review the following plan we discussed and let me know if I can assist you in the future.   Referrals/Orders/Follow-Ups/Clinician Recommendations:   You have an order for:  []   2D Mammogram  [x]   3D Mammogram  []   Bone Density     Please call for appointment:  [facility]  [address] [place] [address] [phone]  Make sure to wear two-piece clothing.  No lotions, powders, or deodorants the day of the appointment. Make sure to bring picture ID and insurance card.  Bring list of medications you are currently taking including any supplements.    This is a list of the screening recommended for you and due dates:  Health Maintenance  Topic Date Due   Mammogram  06/25/2024   Screening for Lung Cancer  08/07/2024   Medicare Annual Wellness Visit  02/11/2025   DTaP/Tdap/Td vaccine (3 - Td or Tdap) 04/26/2027   Colon Cancer Screening  06/22/2029   Pneumonia Vaccine  Completed   Flu Shot  Completed   DEXA scan (bone density measurement)  Completed   Hepatitis C Screening  Completed   Zoster (Shingles) Vaccine  Completed   HPV Vaccine  Aged Out   COVID-19 Vaccine  Discontinued    Advanced directives: (Declined) Advance directive discussed with you today. Even though you declined this today, please call our office should you change your mind, and we can give you the proper paperwork for you to fill out.  Next Medicare Annual Wellness Visit scheduled for next year: Yes 02/12/25 @ 8:50am televisit

## 2024-02-12 NOTE — Progress Notes (Signed)
 Please attest and cosign this visit due to patients primary care provider not being in the office at the time the visit was completed.    Subjective:   Gloria Neal is a 68 y.o. who presents for a Medicare Wellness preventive visit.  Visit Complete: Virtual I connected with  Gloria Neal on 02/12/24 by a audio enabled telemedicine application and verified that I am speaking with the correct person using two identifiers.  Patient Location: Home  Provider Location: Home Office  I discussed the limitations of evaluation and management by telemedicine. The patient expressed understanding and agreed to proceed.  Vital Signs: Because this visit was a virtual/telehealth visit, some criteria may be missing or patient reported. Any vitals not documented were not able to be obtained and vitals that have been documented are patient reported.  VideoDeclined- This patient declined Librarian, academic. Therefore the visit was completed with audio only.  AWV Questionnaire: No: Patient Medicare AWV questionnaire was not completed prior to this visit.  Cardiac Risk Factors include: advanced age (>58men, >48 women);dyslipidemia;hypertension     Objective:    Today's Vitals   02/12/24 0809  Weight: 164 lb (74.4 kg)  Height: 5\' 7"  (1.702 m)   Body mass index is 25.69 kg/m.     02/12/2024    8:23 AM 02/07/2023    8:50 AM 06/22/2022    8:10 AM 07/26/2015    9:11 PM  Advanced Directives  Does Patient Have a Medical Advance Directive? No No No No  Would patient like information on creating a medical advance directive?  No - Patient declined  No - patient declined information    Current Medications (verified) Outpatient Encounter Medications as of 02/12/2024  Medication Sig   atorvastatin (LIPITOR) 20 MG tablet TAKE 1 TABLET BY MOUTH EVERY DAY IN THE EVENING FOR CHOLESTEROL   buPROPion (WELLBUTRIN SR) 150 MG 12 hr tablet TAKE 1 TABLET (150 MG TOTAL) BY MOUTH  DAILY. FOR SMOKING CESSATION   calcium-vitamin D (OSCAL WITH D) 500-5 MG-MCG tablet Take 1 tablet by mouth.   glucosamine-chondroitin 500-400 MG tablet Take 1 tablet by mouth 3 (three) times daily.   levothyroxine (SYNTHROID) 25 MCG tablet TAKE 1 TABLET BY MOUTH EVERY DAY IN THE MORNING EMPTY STONACH NO FOOD 30 MINUTES   Omega-3 Fatty Acids (FISH OIL PO) Take by mouth.   celecoxib (CELEBREX) 200 MG capsule Take 1 capsule (200 mg total) by mouth daily. (Patient not taking: Reported on 02/12/2024)   No facility-administered encounter medications on file as of 02/12/2024.    Allergies (verified) Patient has no known allergies.   History: Past Medical History:  Diagnosis Date   Acute shoulder pain 08/07/2018   Cervical dysplasia    Heart murmur    no longer when she has younger   Hypertension    Mitral valve prolapse    Rheumatic fever    68 years old   Thyroid disease    Tobacco abuse    Vertigo    Past Surgical History:  Procedure Laterality Date   BREAST BIOPSY Left    August 2023 benign   COLONOSCOPY WITH PROPOFOL N/A 06/22/2022   Procedure: COLONOSCOPY WITH PROPOFOL;  Surgeon: Wyline Mood, MD;  Location: [facility] ENDOSCOPY;  Service: Gastroenterology;  Laterality: N/A;   CYSTECTOMY     right brow and buttocks   TONSILLECTOMY  12/05/1970   Family History  Problem Relation Age of Onset   Glaucoma Mother    Hypothyroidism Mother  Cancer Father        Lung CA   Arthritis Maternal Grandmother    Hypothyroidism Sister    Cancer Sister    Social History   Socioeconomic History   Marital status: Married    Spouse name: Not on file   Number of children: Not on file   Years of education: Not on file   Highest education level: 12th grade  Occupational History   Occupation: Accts Payable  Tobacco Use   Smoking status: Former    Current packs/day: 0.00    Average packs/day: 1.5 packs/day for 44.0 years (66.0 ttl pk-yrs)    Types: Cigarettes, Cigars    Start date: 03/06/1975     Quit date: 03/06/2019    Years since quitting: 4.9   Smokeless tobacco: Never   Tobacco comments:    2 cigars a day  Substance and Sexual Activity   Alcohol use: Not Currently    Comment: occassional   Drug use: No   Sexual activity: Not on file  Other Topics Concern   Not on file  Social History Narrative   Married.   No children.   [place]oved from [place] last August for job.   Vacations in [place] every year.   Enjoys reading in her free time.   Social Drivers of Corporate investment banker Strain: Low Risk  (02/12/2024)   Overall Financial Resource Strain (CARDIA)    Difficulty of Paying Living Expenses: Not hard at all  Food Insecurity: No Food Insecurity (02/12/2024)   Hunger Vital Sign    Worried About Running Out of Food in the Last Year: Never true    Ran Out of Food in the Last Year: Never true  Transportation Needs: No Transportation Needs (02/12/2024)   PRAPARE - Administrator, Civil Service (Medical): No    Lack of Transportation (Non-Medical): No  Physical Activity: Sufficiently Active (02/12/2024)   Exercise Vital Sign    Days of Exercise per Week: 7 days    Minutes of Exercise per Session: 60 min  Stress: No Stress Concern Present (02/12/2024)   [employer] of Occupational Health - Occupational Stress Questionnaire    Feeling of Stress : Not at all  Recent Concern: Stress - Stress Concern Present (01/11/2024)   [employer] of Occupational Health - Occupational Stress Questionnaire    Feeling of Stress : To some extent  Social Connections: Moderately Isolated (02/12/2024)   Social Connection and Isolation Panel [NHANES]    Frequency of Communication with Friends and Family: More than three times a week    Frequency of Social Gatherings with Friends and Family: More than three times a week    Attends Religious Services: Never    Database administrator or Organizations: No    Attends Engineer, structural: Never    Marital Status: Married     Tobacco Counseling Counseling given: Not Answered Tobacco comments: 2 cigars a day    Clinical Intake:  Pre-visit preparation completed: Yes  Pain : No/denies pain     BMI - recorded: 25.69 Nutritional Status: BMI 25 -29 Overweight Nutritional Risks: None Diabetes: No  How often do you need to have someone help you when you read instructions, pamphlets, or other written materials from your doctor or pharmacy?: 1 - Never  Interpreter Needed?: No  Comments: lives with husband Information entered by :: B.Sharah Finnell,LPN   Activities of Daily Living     02/12/2024    8:23 AM  In  your present state of health, do you have any difficulty performing the following activities:  Hearing? 1  Vision? 0  Difficulty concentrating or making decisions? 0  Walking or climbing stairs? 0  Dressing or bathing? 0  Doing errands, shopping? 0  Preparing Food and eating ? N  Using the Toilet? N  In the past six months, have you accidently leaked urine? N  Do you have problems with loss of bowel control? N  Managing your Medications? N  Managing your Finances? N  Housekeeping or managing your Housekeeping? N    Patient Care Team: Doreene Nest, NP as PCP - General (Nurse Practitioner)  Indicate any recent Medical Services you may have received from other than [facility] providers in the past year (date may be approximate).     Assessment:   This is a routine wellness examination for [place].  Hearing/Vision screen Hearing Screening - Comments:: Pt says her hearing is ok;only difficulty is background noises and tinnitus Vision Screening - Comments:: Pt says her vision is good w/glasses  Dr Dion Body   Goals Addressed             This Visit's Progress    Patient Stated       02/12/24-Keep walking, eat better, and stay healthy.       Depression Screen     02/12/2024    8:20 AM 06/14/2023    7:52 AM 02/07/2023    8:48 AM 06/09/2022    7:41 AM 06/03/2021   10:59 AM  08/07/2018    2:48 PM  PHQ 2/9 Scores  PHQ - 2 Score 0 0 0 0 0 0  PHQ- 9 Score    0 6     Fall Risk     02/12/2024    8:15 AM 06/14/2023    7:52 AM 02/07/2023    8:51 AM 06/09/2022    7:41 AM 06/03/2021   10:59 AM  Fall Risk   Falls in the past year? 0 0 0 0 0  Number falls in past yr: 0 0  0 0  Injury with Fall? 0 0  0 0  Risk for fall due to : No Fall Risks No Fall Risks No Fall Risks History of fall(s)   Follow up Falls prevention discussed;Education provided Falls evaluation completed Falls evaluation completed;Falls prevention discussed      MEDICARE RISK AT HOME:  Medicare Risk at Home Any stairs in or around the home?: Yes If so, are there any without handrails?: Yes Home free of loose throw rugs in walkways, pet beds, electrical cords, etc?: Yes Adequate lighting in your home to reduce risk of falls?: Yes Life alert?: No Use of a cane, walker or w/c?: No Grab bars in the bathroom?: Yes Shower chair or bench in shower?: Yes (does not use) Elevated toilet seat or a handicapped toilet?: No  TIMED UP AND GO:  Was the test performed?  No  Cognitive Function: 6CIT completed        02/12/2024    8:36 AM 02/07/2023    8:52 AM  6CIT Screen  What Year? 0 points 0 points  What month? 0 points 0 points  What time? 0 points 0 points  Count back from 20 0 points 0 points  Months in reverse 0 points 0 points  Repeat phrase 0 points 0 points  Total Score 0 points 0 points    Immunizations Immunization History  Administered Date(s) Administered   Fluad Quad(high Dose 65+) 08/23/2022, 08/30/2023  Influenza, Seasonal, Injecte, Preservative Fre 10/16/2017   Influenza,inj,Quad PF,6+ Mos 09/07/2015, 10/18/2016, 08/07/2018, 08/29/2019, 09/10/2020   PFIZER(Purple Top)SARS-COV-2 Vaccination 03/05/2020, 03/30/2020, 10/30/2020, 04/15/2021   PNEUMOCOCCAL CONJUGATE-20 11/17/2021   Pfizer(Comirnaty)Fall Seasonal Vaccine 12 years and older 10/17/2022, 08/30/2023   Pneumococcal  Polysaccharide-23 04/06/2015   Td 04/25/2017   Tdap 12/05/2006   Zoster Recombinant(Shingrix) 05/07/2020, 09/10/2020   Zoster, Live 04/23/2016    Screening Tests Health Maintenance  Topic Date Due   MAMMOGRAM  06/25/2024   Lung Cancer Screening  08/07/2024   Medicare Annual Wellness (AWV)  02/11/2025   DTaP/Tdap/Td (3 - Td or Tdap) 04/26/2027   Colonoscopy  06/22/2029   Pneumonia Vaccine 17+ Years old  Completed   INFLUENZA VACCINE  Completed   DEXA SCAN  Completed   Hepatitis C Screening  Completed   Zoster Vaccines- Shingrix  Completed   HPV VACCINES  Aged Out   COVID-19 Vaccine  Discontinued    Health Maintenance  There are no preventive care reminders to display for this patient.  Health Maintenance Items Addressed: Mammogram ordered  Additional Screening:  Vision Screening: Recommended annual ophthalmology exams for early detection of glaucoma and other disorders of the eye.  Dental Screening: Recommended annual dental exams for proper oral hygiene  Community Resource Referral / Chronic Care Management: CRR required this visit?  No   CCM required this visit?  Appt scheduled with PCP     Plan:     I have personally reviewed and noted the following in the patient's chart:   Medical and social history Use of alcohol, tobacco or illicit drugs  Current medications and supplements including opioid prescriptions. Patient is not currently taking opioid prescriptions. Functional ability and status Nutritional status Physical activity Advanced directives List of other physicians Hospitalizations, surgeries, and ER visits in previous 12 months Vitals Screenings to include cognitive, depression, and falls Referrals and appointments  In addition, I have reviewed and discussed with patient certain preventive protocols, quality metrics, and best practice recommendations. A written personalized care plan for preventive services as well as general preventive health  recommendations were provided to patient.     Sue Lush, LPN   1/61/0960   After Visit Summary: (MyChart) Due to this being a telephonic visit, the after visit summary with patients personalized plan was offered to patient via MyChart   Notes: Nothing significant to report at this time.

## 2024-04-19 ENCOUNTER — Other Ambulatory Visit: Payer: Self-pay | Admitting: Primary Care

## 2024-04-19 DIAGNOSIS — Z720 Tobacco use: Secondary | ICD-10-CM

## 2024-04-22 NOTE — Telephone Encounter (Signed)
Patient is due for CPE/follow up in late July, this will be required prior to any further refills.  Please schedule, thank you!   

## 2024-04-23 NOTE — Telephone Encounter (Signed)
 Pt already has cpe scheduled for 06/18/24

## 2024-05-14 DIAGNOSIS — R7303 Prediabetes: Secondary | ICD-10-CM | POA: Diagnosis not present

## 2024-05-14 DIAGNOSIS — Z008 Encounter for other general examination: Secondary | ICD-10-CM | POA: Diagnosis not present

## 2024-05-14 DIAGNOSIS — E785 Hyperlipidemia, unspecified: Secondary | ICD-10-CM | POA: Diagnosis not present

## 2024-05-14 DIAGNOSIS — J439 Emphysema, unspecified: Secondary | ICD-10-CM | POA: Diagnosis not present

## 2024-05-14 DIAGNOSIS — I10 Essential (primary) hypertension: Secondary | ICD-10-CM | POA: Diagnosis not present

## 2024-05-14 DIAGNOSIS — E039 Hypothyroidism, unspecified: Secondary | ICD-10-CM | POA: Diagnosis not present

## 2024-05-19 ENCOUNTER — Other Ambulatory Visit: Payer: Self-pay | Admitting: Primary Care

## 2024-05-19 DIAGNOSIS — E039 Hypothyroidism, unspecified: Secondary | ICD-10-CM

## 2024-05-21 ENCOUNTER — Other Ambulatory Visit: Payer: Self-pay | Admitting: Primary Care

## 2024-05-21 DIAGNOSIS — Z1231 Encounter for screening mammogram for malignant neoplasm of breast: Secondary | ICD-10-CM

## 2024-06-18 ENCOUNTER — Ambulatory Visit: Payer: Self-pay | Admitting: Primary Care

## 2024-06-18 ENCOUNTER — Encounter: Payer: Self-pay | Admitting: Primary Care

## 2024-06-18 ENCOUNTER — Ambulatory Visit (INDEPENDENT_AMBULATORY_CARE_PROVIDER_SITE_OTHER): Admitting: Primary Care

## 2024-06-18 ENCOUNTER — Other Ambulatory Visit: Payer: Self-pay | Admitting: Primary Care

## 2024-06-18 VITALS — BP 108/60 | HR 82 | Temp 97.2°F | Ht 67.0 in | Wt 164.0 lb

## 2024-06-18 DIAGNOSIS — Z0000 Encounter for general adult medical examination without abnormal findings: Secondary | ICD-10-CM | POA: Diagnosis not present

## 2024-06-18 DIAGNOSIS — R7303 Prediabetes: Secondary | ICD-10-CM | POA: Diagnosis not present

## 2024-06-18 DIAGNOSIS — E785 Hyperlipidemia, unspecified: Secondary | ICD-10-CM

## 2024-06-18 DIAGNOSIS — E039 Hypothyroidism, unspecified: Secondary | ICD-10-CM

## 2024-06-18 DIAGNOSIS — M1991 Primary osteoarthritis, unspecified site: Secondary | ICD-10-CM

## 2024-06-18 DIAGNOSIS — Z720 Tobacco use: Secondary | ICD-10-CM

## 2024-06-18 DIAGNOSIS — E2839 Other primary ovarian failure: Secondary | ICD-10-CM | POA: Diagnosis not present

## 2024-06-18 DIAGNOSIS — I10 Essential (primary) hypertension: Secondary | ICD-10-CM | POA: Diagnosis not present

## 2024-06-18 DIAGNOSIS — G479 Sleep disorder, unspecified: Secondary | ICD-10-CM

## 2024-06-18 LAB — COMPREHENSIVE METABOLIC PANEL WITH GFR
ALT: 27 U/L (ref 0–35)
AST: 22 U/L (ref 0–37)
Albumin: 4.1 g/dL (ref 3.5–5.2)
Alkaline Phosphatase: 85 U/L (ref 39–117)
BUN: 13 mg/dL (ref 6–23)
CO2: 29 meq/L (ref 19–32)
Calcium: 9.5 mg/dL (ref 8.4–10.5)
Chloride: 107 meq/L (ref 96–112)
Creatinine, Ser: 0.83 mg/dL (ref 0.40–1.20)
GFR: 72.47 mL/min (ref >60.00)
Glucose, Bld: 108 mg/dL — ABNORMAL HIGH (ref 70–99)
Potassium: 4.9 meq/L (ref 3.5–5.1)
Sodium: 141 meq/L (ref 135–145)
Total Bilirubin: 0.5 mg/dL (ref 0.2–1.2)
Total Protein: 6.2 g/dL (ref 6.0–8.3)

## 2024-06-18 LAB — LIPID PANEL
Cholesterol: 149 mg/dL (ref 0–200)
HDL: 60.8 mg/dL (ref >39.00)
LDL Cholesterol: 65 mg/dL (ref 0–99)
NonHDL: 87.95
Total CHOL/HDL Ratio: 2
Triglycerides: 115 mg/dL (ref 0.0–149.0)
VLDL: 23 mg/dL (ref 0.0–40.0)

## 2024-06-18 LAB — CBC
HCT: 42.6 % (ref 36.0–46.0)
Hemoglobin: 14.1 g/dL (ref 12.0–15.0)
MCHC: 33 g/dL (ref 30.0–36.0)
MCV: 86.6 fl (ref 78.0–100.0)
Platelets: 327 K/uL (ref 150.0–400.0)
RBC: 4.92 Mil/uL (ref 3.87–5.11)
RDW: 14.5 % (ref 11.5–15.5)
WBC: 5.3 K/uL (ref 4.0–10.5)

## 2024-06-18 LAB — TSH: TSH: 1.27 u[IU]/mL (ref 0.35–5.50)

## 2024-06-18 LAB — HEMOGLOBIN A1C: Hgb A1c MFr Bld: 6.3 % (ref 4.6–6.5)

## 2024-06-18 NOTE — Assessment & Plan Note (Signed)
 Immunizations UTD.  Mammogram scheduled.  Colonoscopy UTD, due 2030  Discussed the importance of a healthy diet and regular exercise in order for weight loss, and to reduce the risk of further co-morbidity.  Exam stable. Labs pending.  Follow up in 1 year for repeat physical.

## 2024-06-18 NOTE — Assessment & Plan Note (Signed)
She is taking levothyroxine correctly.   Continue levothyroxine 25 mcg daily. Repeat TSH pending. 

## 2024-06-18 NOTE — Assessment & Plan Note (Signed)
 Waxes and wanes.  Discussed to resume melatonin. If no improvement then consider Trazodone.

## 2024-06-18 NOTE — Progress Notes (Signed)
 Subjective:    Patient ID: Gloria Neal, female    DOB: Nov 13, 1956, 68 y.o.   MRN: [id number]  HPI  Gloria Neal is a very pleasant 68 y.o. female who presents today for complete physical and follow up of chronic conditions.  Immunizations: -Tetanus: Completed in 2018  -Shingles: Completed Shingrix series -Pneumonia: Completed Prevnar 20 in 2022  Diet: Fair diet.  Exercise: Regular exercise.  Eye exam: Completes annually  Dental exam: Completes semi-annually     Mammogram: Completed in July 2024, scheduled for July 2025 Bone Density Scan: Completed in January 2023  Colonoscopy: Completed in 2023, due 2030 Lung Cancer Screening: Completed in September 2024, scheduled for September 2025.  BP Readings from Last 3 Encounters:  06/18/24 108/60  01/11/24 92/68  07/28/23 130/80         Review of Systems  Constitutional:  Negative for unexpected weight change.  HENT:  Negative for rhinorrhea.   Respiratory:  Negative for cough and shortness of breath.   Cardiovascular:  Negative for chest pain.  Gastrointestinal:  Negative for constipation and diarrhea.  Genitourinary:  Negative for difficulty urinating.  Musculoskeletal:  Positive for arthralgias.  Skin:  Negative for rash.  Allergic/Immunologic: Negative for environmental allergies.  Neurological:  Negative for dizziness, numbness and headaches.  Psychiatric/Behavioral:  Positive for sleep disturbance.          Past Medical History:  Diagnosis Date   Acute pain of right shoulder 08/07/2018   Acute shoulder pain 08/07/2018   Cervical dysplasia    Heart murmur    no longer when she has younger   Hypertension    Mitral valve prolapse    Rheumatic fever    68 years old   Thyroid  disease    Tobacco abuse    Vertigo     Social History   Socioeconomic History   Marital status: Married    Spouse name: Not on file   Number of children: Not on file   Years of education: Not on file   Highest  education level: 12th grade  Occupational History   Occupation: Accts Payable  Tobacco Use   Smoking status: Former    Current packs/day: 0.00    Average packs/day: 1.5 packs/day for 44.0 years (66.0 ttl pk-yrs)    Types: Cigarettes, Cigars    Start date: 03/06/1975    Quit date: 03/06/2019    Years since quitting: 5.2   Smokeless tobacco: Never   Tobacco comments:    2 cigars a day  Substance and Sexual Activity   Alcohol use: Not Currently    Comment: occassional   Drug use: No   Sexual activity: Not on file  Other Topics Concern   Not on file  Social History Narrative   Married.   No children.   [place]oved from [place] last August for job.   Vacations in [place] every year.   Enjoys reading in her free time.   Social Drivers of Corporate investment banker Strain: Low Risk  (02/12/2024)   Overall Financial Resource Strain (CARDIA)    Difficulty of Paying Living Expenses: Not hard at all  Food Insecurity: No Food Insecurity (02/12/2024)   Hunger Vital Sign    Worried About Running Out of Food in the Last Year: Never true    Ran Out of Food in the Last Year: Never true  Transportation Needs: No Transportation Needs (02/12/2024)   PRAPARE - Administrator, Civil Service (Medical): No    Lack  of Transportation (Non-Medical): No  Physical Activity: Sufficiently Active (02/12/2024)   Exercise Vital Sign    Days of Exercise per Week: 7 days    Minutes of Exercise per Session: 60 min  Stress: No Stress Concern Present (02/12/2024)   [employer] of Occupational Health - Occupational Stress Questionnaire    Feeling of Stress : Not at all  Recent Concern: Stress - Stress Concern Present (01/11/2024)   [employer] of Occupational Health - Occupational Stress Questionnaire    Feeling of Stress : To some extent  Social Connections: Moderately Isolated (02/12/2024)   Social Connection and Isolation Panel    Frequency of Communication with Friends and Family: More than three  times a week    Frequency of Social Gatherings with Friends and Family: More than three times a week    Attends Religious Services: Never    Database administrator or Organizations: No    Attends Banker Meetings: Never    Marital Status: Married  Catering manager Violence: Not At Risk (02/12/2024)   Humiliation, Afraid, Rape, and Kick questionnaire    Fear of Current or Ex-Partner: No    Emotionally Abused: No    Physically Abused: No    Sexually Abused: No    Past Surgical History:  Procedure Laterality Date   BREAST BIOPSY Left    August 2023 benign   COLONOSCOPY WITH PROPOFOL  N/A 06/22/2022   Procedure: COLONOSCOPY WITH PROPOFOL ;  Surgeon: Therisa Bi, MD;  Location: [facility] ENDOSCOPY;  Service: Gastroenterology;  Laterality: N/A;   CYSTECTOMY     right brow and buttocks   TONSILLECTOMY  12/05/1970    Family History  Problem Relation Age of Onset   Glaucoma Mother    Hypothyroidism Mother    Cancer Father        Lung CA   Arthritis Maternal Grandmother    Hypothyroidism Sister    Cancer Sister     No Known Allergies  Current Outpatient Medications on File Prior to Visit  Medication Sig Dispense Refill   atorvastatin  (LIPITOR) 20 MG tablet TAKE 1 TABLET BY MOUTH EVERY DAY IN THE EVENING FOR CHOLESTEROL 90 tablet 2   buPROPion  (WELLBUTRIN  SR) 150 MG 12 hr tablet TAKE 1 TABLET (150 MG TOTAL) BY MOUTH DAILY. FOR SMOKING CESSATION 90 tablet 0   calcium -vitamin D  (OSCAL WITH D) 500-5 MG-MCG tablet Take 1 tablet by mouth.     celecoxib  (CELEBREX ) 200 MG capsule Take 1 capsule (200 mg total) by mouth daily. 30 capsule 1   glucosamine-chondroitin 500-400 MG tablet Take 1 tablet by mouth 3 (three) times daily.     levothyroxine  (SYNTHROID ) 25 MCG tablet TAKE 1 TABLET BY MOUTH EVERY DAY IN THE MORNING EMPTY STONACH NO FOOD 30 MINUTES 90 tablet 0   Omega-3 Fatty Acids (FISH OIL PO) Take by mouth.     No current facility-administered medications on file prior to visit.     BP 108/60   Pulse 82   Temp (!) 97.2 F (36.2 C) (Temporal)   Ht 5' 7 (1.702 m)   Wt 164 lb (74.4 kg)   SpO2 98%   BMI 25.69 kg/m  Objective:   Physical Exam HENT:     Right Ear: Tympanic membrane and ear canal normal.     Left Ear: Tympanic membrane and ear canal normal.  Eyes:     Pupils: Pupils are equal, round, and reactive to light.  Cardiovascular:     Rate and Rhythm: Normal rate and  regular rhythm.  Pulmonary:     Effort: Pulmonary effort is normal.     Breath sounds: Normal breath sounds.  Abdominal:     General: Bowel sounds are normal.     Palpations: Abdomen is soft.     Tenderness: There is no abdominal tenderness.  Musculoskeletal:        General: Normal range of motion.     Cervical back: Neck supple.  Skin:    General: Skin is warm and dry.  Neurological:     Mental Status: She is alert and oriented to person, place, and time.     Cranial Nerves: No cranial nerve deficit.     Deep Tendon Reflexes:     Reflex Scores:      Patellar reflexes are 2+ on the right side and 2+ on the left side. Psychiatric:        Mood and Affect: Mood normal.           Assessment & Plan:  Preventative health care Assessment & Plan: Immunizations UTD.  Mammogram scheduled.  Colonoscopy UTD, due 2030  Discussed the importance of a healthy diet and regular exercise in order for weight loss, and to reduce the risk of further co-morbidity.  Exam stable. Labs pending.  Follow up in 1 year for repeat physical.    Estrogen deficiency -     DG Bone Density; Future  Essential hypertension Assessment & Plan: Controlled.  Remain off treatment. Continue to monitor.   Orders: -     CBC  Hypothyroidism, unspecified type Assessment & Plan: She is taking levothyroxine  correctly. Continue levothyroxine  25 mcg daily. Repeat TSH pending.  Orders: -     TSH  Hyperlipidemia, unspecified hyperlipidemia type Assessment & Plan: Commended her on regular  exercise and healthier diet.   Continue atorvastatin  20 mg daily.  Repeat lipid panel pending.   Orders: -     Comprehensive metabolic panel with GFR -     Lipid panel  Prediabetes Assessment & Plan: Repeat A1C pending.  Orders: -     Hemoglobin A1c  Tobacco abuse Assessment & Plan: Continue bupropion  ER 150 mg daily for smoking cravings.  Scheduled for lung cancer screening.    Primary osteoarthritis, unspecified site Assessment & Plan: Stable.  Continue Celebrex  200 mg PRN.   Sleep disorder Assessment & Plan: Waxes and wanes.  Discussed to resume melatonin. If no improvement then consider Trazodone.         Yuliet Needs K Md Smola, NP

## 2024-06-18 NOTE — Assessment & Plan Note (Signed)
 Stable.  Continue Celebrex 200 mg PRN.

## 2024-06-18 NOTE — Assessment & Plan Note (Signed)
 Continue bupropion  ER 150 mg daily for smoking cravings.  Scheduled for lung cancer screening.

## 2024-06-18 NOTE — Assessment & Plan Note (Signed)
 Commended her on regular exercise and healthier diet.   Continue atorvastatin  20 mg daily.  Repeat lipid panel pending.

## 2024-06-18 NOTE — Assessment & Plan Note (Signed)
 Repeat A1C pending.

## 2024-06-18 NOTE — Patient Instructions (Signed)
 Stop by the lab prior to leaving today. I will notify you of your results once received.   It was a pleasure to see you today!

## 2024-06-18 NOTE — Assessment & Plan Note (Signed)
Controlled.  Remain off treatment. Continue to monitor. 

## 2024-06-26 ENCOUNTER — Ambulatory Visit
Admission: RE | Admit: 2024-06-26 | Discharge: 2024-06-26 | Disposition: A | Discharge status: Home / Self Care | Source: Ambulatory Visit | Attending: Primary Care | Admitting: Primary Care

## 2024-06-26 DIAGNOSIS — Z1231 Encounter for screening mammogram for malignant neoplasm of breast: Secondary | ICD-10-CM | POA: Diagnosis not present

## 2024-07-01 ENCOUNTER — Ambulatory Visit: Payer: Self-pay | Admitting: Primary Care

## 2024-07-21 ENCOUNTER — Other Ambulatory Visit: Payer: Self-pay | Admitting: Primary Care

## 2024-07-21 DIAGNOSIS — Z720 Tobacco use: Secondary | ICD-10-CM

## 2024-08-08 ENCOUNTER — Ambulatory Visit
Admission: RE | Admit: 2024-08-08 | Discharge: 2024-08-08 | Disposition: A | Discharge status: Home / Self Care | Source: Ambulatory Visit | Attending: Acute Care | Admitting: Acute Care

## 2024-08-08 DIAGNOSIS — Z87891 Personal history of nicotine dependence: Secondary | ICD-10-CM | POA: Diagnosis not present

## 2024-08-08 DIAGNOSIS — Z122 Encounter for screening for malignant neoplasm of respiratory organs: Secondary | ICD-10-CM | POA: Diagnosis not present

## 2024-08-17 ENCOUNTER — Other Ambulatory Visit: Payer: Self-pay | Admitting: Primary Care

## 2024-08-17 DIAGNOSIS — E039 Hypothyroidism, unspecified: Secondary | ICD-10-CM

## 2024-08-22 ENCOUNTER — Other Ambulatory Visit: Payer: Self-pay

## 2024-08-22 DIAGNOSIS — Z122 Encounter for screening for malignant neoplasm of respiratory organs: Secondary | ICD-10-CM

## 2024-08-22 DIAGNOSIS — Z87891 Personal history of nicotine dependence: Secondary | ICD-10-CM

## 2024-08-23 DIAGNOSIS — I2510 Atherosclerotic heart disease of native coronary artery without angina pectoris: Secondary | ICD-10-CM

## 2024-08-29 ENCOUNTER — Ambulatory Visit
Admission: RE | Admit: 2024-08-29 | Discharge: 2024-08-29 | Disposition: A | Discharge status: Home / Self Care | Payer: Self-pay | Source: Ambulatory Visit | Attending: Primary Care | Admitting: Primary Care

## 2024-08-29 DIAGNOSIS — I2510 Atherosclerotic heart disease of native coronary artery without angina pectoris: Secondary | ICD-10-CM | POA: Insufficient documentation

## 2024-08-30 ENCOUNTER — Ambulatory Visit: Payer: Self-pay | Admitting: Primary Care

## 2024-08-30 DIAGNOSIS — I2510 Atherosclerotic heart disease of native coronary artery without angina pectoris: Secondary | ICD-10-CM

## 2024-09-11 ENCOUNTER — Encounter: Payer: Self-pay | Admitting: Internal Medicine

## 2024-09-11 ENCOUNTER — Ambulatory Visit: Attending: Internal Medicine | Admitting: Internal Medicine

## 2024-09-11 VITALS — BP 130/82 | HR 90 | Ht 67.0 in | Wt 166.4 lb

## 2024-09-11 DIAGNOSIS — I10 Essential (primary) hypertension: Secondary | ICD-10-CM | POA: Diagnosis not present

## 2024-09-11 DIAGNOSIS — I2510 Atherosclerotic heart disease of native coronary artery without angina pectoris: Secondary | ICD-10-CM

## 2024-09-11 DIAGNOSIS — R072 Precordial pain: Secondary | ICD-10-CM | POA: Diagnosis not present

## 2024-09-11 DIAGNOSIS — I099 Rheumatic heart disease, unspecified: Secondary | ICD-10-CM

## 2024-09-11 NOTE — Patient Instructions (Signed)
 Medication Instructions:  Your physician recommends that you continue on your current medications as directed. Please refer to the Current Medication list given to you today.    *If you need a refill on your cardiac medications before your next appointment, please call your pharmacy*  Lab Work: No labs ordered today    Testing/Procedures: Your physician has requested that you have an echocardiogram. Echocardiography is a painless test that uses sound waves to create images of your heart. It provides your doctor with information about the size and shape of your heart and how well your heart's chambers and valves are working.   You may receive an ultrasound enhancing agent through an IV if needed to better visualize your heart during the echo. This procedure takes approximately one hour.  There are no restrictions for this procedure.  This will take place at [address] [facility]) [address], [place] [address]  Please note: We ask at that you not bring children with you during ultrasound (echo/ vascular) testing. Due to room size and safety concerns, children are not allowed in the ultrasound rooms during exams. Our front office staff cannot provide observation of children in our lobby area while testing is being conducted. An adult accompanying a patient to their appointment will only be allowed in the ultrasound room at the discretion of the ultrasound technician under special circumstances. We apologize for any inconvenience.      Please report to Radiology at the [facility] Main Entrance 30 minutes early for your test.  [address] [place], [place] [address]                         OR   Please report to Radiology at [facility] Main Entrance, medical mall, 30 mins prior to your test.  [address]  [place], [place]  How to Prepare for Your Cardiac PET/CT Stress Test:  Nothing to eat or drink, except water , 3 hours prior to  arrival time.  NO caffeine/decaffeinated products, or chocolate 12 hours prior to arrival. (Please note decaffeinated beverages (teas/coffees) still contain caffeine).  If you have caffeine within 12 hours prior, the test will need to be rescheduled.  Medication instructions: Do not take erectile dysfunction medications for 72 hours prior to test (sildenafil, tadalafil) Do not take nitrates (isosorbide mononitrate, Ranexa) the day before or day of test Do not take tamsulosin the day before or morning of test Hold theophylline containing medications for 12 hours. Hold Dipyridamole 48 hours prior to the test.  Diabetic Preparation: If able to eat breakfast prior to 3 hour fasting, you may take all medications, including your insulin. Do not worry if you miss your breakfast dose of insulin - start at your next meal. If you do not eat prior to 3 hour fast-Hold all diabetes (oral and insulin) medications. Patients who wear a continuous glucose monitor MUST remove the device prior to scanning.  You may take your remaining medications with water .  NO perfume, cologne or lotion on chest or abdomen area. FEMALES - Please avoid wearing dresses to this appointment.  Total time is 1 to 2 hours; you may want to bring reading material for the waiting time.  IF YOU THINK YOU MAY BE PREGNANT, OR ARE NURSING PLEASE INFORM THE TECHNOLOGIST.  In preparation for your appointment, medication and supplies will be purchased.  Appointment availability is limited, so if you need to cancel or reschedule, please  call the Radiology Department Scheduler at [phone] 24 hours in advance to avoid a cancellation fee of $100.00  What to Expect When you Arrive:  Once you arrive and check in for your appointment, you will be taken to a preparation room within the Radiology Department.  A technologist or Nurse will obtain your medical history, verify that you are correctly prepped for the exam, and explain the procedure.   Afterwards, an IV will be started in your arm and electrodes will be placed on your skin for EKG monitoring during the stress portion of the exam. Then you will be escorted to the PET/CT scanner.  There, staff will get you positioned on the scanner and obtain a blood pressure and EKG.  During the exam, you will continue to be connected to the EKG and blood pressure machines.  A small, safe amount of a radioactive tracer will be injected in your IV to obtain a series of pictures of your heart along with an injection of a stress agent.    After your Exam:  It is recommended that you eat a meal and drink a caffeinated beverage to counter act any effects of the stress agent.  Drink plenty of fluids for the remainder of the day and urinate frequently for the first couple of hours after the exam.  Your doctor will inform you of your test results within 7-10 business days.  For more information and frequently asked questions, please visit our website: [email or web address]  For questions about your test or how to prepare for your test, please call: Cardiac Imaging Nurse Navigators Office: [phone]   Follow-Up: At [facility], you and your health needs are our priority.  As part of our continuing mission to provide you with exceptional heart care, our providers are all part of one team.  This team includes your primary Cardiologist (physician) and Advanced Practice Providers or APPs (Physician Assistants and Nurse Practitioners) who all work together to provide you with the care you need, when you need it.  Your next appointment:   1 month(s)  Provider:   You may see Lonni Hanson, MD or one of the following Advanced Practice Providers on your designated Care Team:   Lonni Meager, NP Lesley Maffucci, PA-C Bernardino Bring, PA-C Cadence Apex, PA-C Tylene Lunch, NP Barnie Hila, NP

## 2024-09-11 NOTE — Progress Notes (Signed)
 Cardiology Office Note:  .   Date:  09/11/2024  ID:  Gloria Neal, DOB 1956/01/06, MRN [id number] PCP: Gretta Comer POUR, NP  [facility] Providers Cardiologist:  None     History of Present Illness: Gloria Neal   Gloria Neal is a 68 y.o. female with history of mitral valve prolapse, hypertension, rheumatic fever (age 44), and prior tobacco use, who has been referred for evaluation of abnormal coronary calcium  score.  Today, Gloria Neal reports that she has been feeling well.  The calcium  score was ordered due to incidentally noted coronary artery calcification on lung cancer screening CT earlier this year.  She walks 3.5 miles regularly over the course of an hour without any limitations.  She has sporadic tightness across her chest, which happens randomly (usually at rest) and resolves within a minute or tow.  She thinks it could be related to stress.  She also has sporadic palpitations that are infrequent and transient without associated symptoms.  She notes mild shortness of breath at times, which she attributes to her emphysema.  Dyspnea has been stable for quite some time.  She has not had any lightheadedness, edema, or claudication.  She notes some dizziness while lying in bed about a month ago, similar to prior episodes of vertigo.  Gloria Neal reports that she had rheumatic fever as a child and was on long-term antibiotics until her mid 20s, when her doctor informed her that she could stop taking penicillin as her heart murmur (she was previously unaware that she had a heart murmur) had resolved.  She does not believe she has ever had an echocardiogram.  ROS: See HPI  Studies Reviewed: Gloria Neal   EKG Interpretation Date/Time:  Wednesday September 11 2024 15:55:07 EDT Ventricular Rate:  90 PR Interval:  134 QRS Duration:  78 QT Interval:  368 QTC Calculation: 450 R Axis:   -13  Text Interpretation: Normal sinus rhythm Low voltage QRS ST & T wave abnormality, consider anterolateral ischemia  ST & T wave abnormality, consider inferior ischemia Abnormal ECG When compared with ECG of 11-Jun-2007 Anterolateral and inferior ST/T abnormality is now present Confirmed by Kaida Games, Lonni [phone]) on 09/11/2024 4:00:53 PM    Coronary calcium  score (08/29/2024): Coronary calcium  score 519 (predominantly involving LAD and RCA), consistent with 94th percentile for age and sex matched controls.  Small hiatal hernia and aortic atherosclerosis noted.  Risk Assessment/Calculations:             Physical Exam:   VS:  BP 130/82 (BP Location: Left Arm, Patient Position: Sitting, Cuff Size: Normal)   Pulse 90   Ht 5' 7 (1.702 m)   Wt 166 lb 6.4 oz (75.5 kg)   SpO2 96%   BMI 26.06 kg/m    Wt Readings from Last 3 Encounters:  09/11/24 166 lb 6.4 oz (75.5 kg)  06/18/24 164 lb (74.4 kg)  02/12/24 164 lb (74.4 kg)    General:  NAD. Neck: No JVD or HJR. Lungs: Clear to auscultation bilaterally without wheezes or crackles. Heart: Regular rate and rhythm without murmurs, rubs, or gallops. Abdomen: Soft, nontender, nondistended. Extremities: No lower extremity edema.  ASSESSMENT AND PLAN: .    Coronary artery calcification and precordial pain: Overall, Gloria Neal feels quite well and is able to walk at a brisk pace without any symptoms.  She notes rare episodes of tightness across her chest that typically lasts less than a minute and is not exertional.  Her recent coronary calcium  score was quite abnormal  with an absolute score of 512, placing her in the 94th percentile for age and sex matched controls.  Her EKG today also shows anterolateral and inferior ST/T changes that are somewhat nonspecific but could be ischemic in nature.  They are new since her only prior tracing in our system from 2008.  We have discussed further evaluation options and have agreed to obtain a myocardial PET/CT.  If she has any significant chest pain in the meantime, she should contact us  (if it has already resolved) or seek  immediate medical attention.  It is reasonable to continue current regimen of aspirin and atorvastatin , as follow-up lipid panel showed excellent LDL control.  It would be prudent to check an LP(a) for further risk stratification with next blood draw as well.  Rheumatic heart disease: Gloria Neal reports a history of rheumatic fever as a child and subsequent heart murmur requiring prolonged course of penicillin into her 37s.  I do not appreciate a murmur on exam today.  I have recommended obtaining a transthoracic echocardiogram for further evaluation.  Based on available information, she is intermediate risk for infective endocarditis.  However, pending the echocardiogram results, I would have a low threshold for providing SBE prophylaxis for dental procedures and other invasive procedures with high risk of transient bacteremia.  Hypertension: Blood pressure borderline today, typically better at home.  Defer medication changes at this time.    Informed Consent   Shared Decision Making/Informed Consent The risks [chest pain, shortness of breath, cardiac arrhythmias, dizziness, blood pressure fluctuations, myocardial infarction, stroke/transient ischemic attack, nausea, vomiting, allergic reaction, radiation exposure, metallic taste sensation and life-threatening complications (estimated to be 1 in 10,000)], benefits (risk stratification, diagnosing coronary artery disease, treatment guidance) and alternatives of a cardiac PET stress test were discussed in detail with Gloria Neal and she agrees to proceed.     Dispo: Return to clinic in 1 month.  Signed, Lonni Hanson, MD

## 2024-09-15 ENCOUNTER — Encounter: Payer: Self-pay | Admitting: Internal Medicine

## 2024-09-17 ENCOUNTER — Encounter (HOSPITAL_COMMUNITY): Payer: Self-pay

## 2024-09-19 ENCOUNTER — Ambulatory Visit
Admission: RE | Admit: 2024-09-19 | Discharge: 2024-09-19 | Disposition: A | Discharge status: Home / Self Care | Source: Ambulatory Visit | Attending: Internal Medicine | Admitting: Internal Medicine

## 2024-09-19 DIAGNOSIS — R072 Precordial pain: Secondary | ICD-10-CM | POA: Insufficient documentation

## 2024-09-19 DIAGNOSIS — I2510 Atherosclerotic heart disease of native coronary artery without angina pectoris: Secondary | ICD-10-CM | POA: Diagnosis not present

## 2024-09-19 DIAGNOSIS — I700 Atherosclerosis of aorta: Secondary | ICD-10-CM | POA: Diagnosis not present

## 2024-09-19 LAB — NM PET CT CARDIAC PERFUSION MULTI W/ABSOLUTE BLOODFLOW
LV dias vol: 69 mL (ref 46–106)
MBFR: 2.57
Nuc Rest EF: 67 %
Nuc Stress EF: 73 %
Peak HR: 110 {beats}/min
Rest HR: 92 {beats}/min
Rest MBF: 1.41 ml/g/min
Rest Nuclear Isotope Dose: 19.4 mCi
SRS: 1
SSS: 1
ST Depression (mm): 0 mm
Stress MBF: 3.63 ml/g/min
Stress Nuclear Isotope Dose: 19.8 mCi
TID: 0.94

## 2024-09-19 MED ORDER — RUBIDIUM RB82 GENERATOR (RUBYFILL)
25.0000 | PACK | Freq: Once | INTRAVENOUS | Status: AC
  Administered 2024-09-19: 19.43 via INTRAVENOUS

## 2024-09-19 MED ORDER — REGADENOSON 0.4 MG/5ML IV SOLN
INTRAVENOUS | Status: AC
  Filled 2024-09-19: qty 5

## 2024-09-19 MED ORDER — REGADENOSON 0.4 MG/5ML IV SOLN
0.4000 mg | Freq: Once | INTRAVENOUS | Status: AC
  Administered 2024-09-19: 0.4 mg via INTRAVENOUS
  Filled 2024-09-19: qty 5

## 2024-09-19 MED ORDER — RUBIDIUM RB82 GENERATOR (RUBYFILL)
25.0000 | PACK | Freq: Once | INTRAVENOUS | Status: AC
  Administered 2024-09-19: 19.81 via INTRAVENOUS

## 2024-09-20 ENCOUNTER — Ambulatory Visit: Payer: Self-pay | Admitting: Internal Medicine

## 2024-09-20 NOTE — Telephone Encounter (Signed)
 Probably best to reschedule Gloria Neal's follow-up until after the echo has been completed.  Thanks.  Lonni Hanson, MD [facility]

## 2024-09-23 NOTE — Progress Notes (Signed)
 Left voicemail to reschedule appt until after echo.

## 2024-10-14 ENCOUNTER — Ambulatory Visit: Admitting: Physician Assistant

## 2024-10-25 ENCOUNTER — Ambulatory Visit: Attending: Internal Medicine

## 2024-10-25 DIAGNOSIS — R072 Precordial pain: Secondary | ICD-10-CM | POA: Diagnosis not present

## 2024-10-25 DIAGNOSIS — I099 Rheumatic heart disease, unspecified: Secondary | ICD-10-CM

## 2024-10-25 LAB — ECHOCARDIOGRAM COMPLETE
AR max vel: 2.67 cm2
AV Area VTI: 3.12 cm2
AV Area mean vel: 2.69 cm2
AV Mean grad: 2 mmHg
AV Peak grad: 4.5 mmHg
Ao pk vel: 1.06 m/s
Area-P 1/2: 3.77 cm2
MV M vel: 2.38 m/s
MV Peak grad: 22.7 mmHg
S' Lateral: 2.8 cm

## 2024-10-29 ENCOUNTER — Ambulatory Visit: Attending: Physician Assistant | Admitting: Physician Assistant

## 2024-10-29 VITALS — BP 121/84 | HR 96 | Ht 67.0 in | Wt 167.0 lb

## 2024-10-29 DIAGNOSIS — I700 Atherosclerosis of aorta: Secondary | ICD-10-CM | POA: Diagnosis not present

## 2024-10-29 DIAGNOSIS — I2510 Atherosclerotic heart disease of native coronary artery without angina pectoris: Secondary | ICD-10-CM | POA: Diagnosis not present

## 2024-10-29 DIAGNOSIS — I10 Essential (primary) hypertension: Secondary | ICD-10-CM | POA: Diagnosis not present

## 2024-10-29 DIAGNOSIS — I099 Rheumatic heart disease, unspecified: Secondary | ICD-10-CM | POA: Diagnosis not present

## 2024-10-29 NOTE — Patient Instructions (Signed)
 Medication Instructions:  Your physician recommends that you continue on your current medications as directed. Please refer to the Current Medication list given to you today.   *If you need a refill on your cardiac medications before your next appointment, please call your pharmacy*  Lab Work: None ordered at this time  If you have labs (blood work) drawn today and your tests are completely normal, you will receive your results only by: MyChart Message (if you have MyChart) OR A paper copy in the mail If you have any lab test that is abnormal or we need to change your treatment, we will call you to review the results.  Testing/Procedures: None ordered at this time   Follow-Up: At [facility], you and your health needs are our priority.  As part of our continuing mission to provide you with exceptional heart care, our providers are all part of one team.  This team includes your primary Cardiologist (physician) and Advanced Practice Providers or APPs (Physician Assistants and Nurse Practitioners) who all work together to provide you with the care you need, when you need it.  Your next appointment:   6 month(s)  Provider:   You may see Lonni Hanson, MD or Bernardino Bring, PA-C

## 2024-10-29 NOTE — Progress Notes (Signed)
 Cardiology Office Note    Date:  10/29/2024   ID:  Gloria Neal, DOB [birth year], MRN [id number]  PCP:  Gretta Comer POUR, NP  Cardiologist:  Lonni Hanson, MD  Electrophysiologist:  None   Chief Complaint: Follow-up  History of Present Illness:   Gloria Neal is a 68 y.o. female with history of CAD, mitral valve prolapse, rheumatic fever (age 15), aortic atherosclerosis, HTN, and prior tobacco use who presents for follow-up of myocardial PET/CT and echo.  She underwent calcium  score in 08/2024 in the setting of incidentally noted coronary artery calcification on lung cancer screening CT earlier in the year.  Score of 519 which was the 94th percentile with coronary calcification noted in the LAD and RCA.  Noncardiac overread notable for a small hiatal hernia and aortic atherosclerosis.  She was evaluated by Dr. Hanson as a new patient on 09/11/2024, at which time she was doing well and walked 3.5 miles regularly over the course of an hour without limitations.  She did report sporadic chest tightness across her chest that would happen randomly and would resolve within a minute or 2.  She also reported sporadic palpitations that were infrequent and transient without associated symptoms as well as mild shortness of breath that she attributed to emphysema.  Regarding her history of rheumatic fever, she was on long-term antibiotics until her mid 53s when her doctor informed her that she could stop taking penicillin as her heart murmur had resolved.  Myocardial PET/CT in 09/2024 showed no evidence of ischemia or infarction with normal LV systolic function and was overall low risk.  Echo in 10/2024 showed an EF of 55 to 60%, mild LVH, grade 1 diastolic dysfunction, normal RV systolic function and ventricular cavity size, mild mitral regurgitation with calcified leaflet tips without overtly rheumatic appearance, and aortic valve sclerosis without evidence of stenosis.  She comes in accompanied by her  husband today and is doing well from a cardiac perspective, without symptoms of angina or cardiac decompensation.  Remains active at baseline walking approximately 3 miles regularly without cardiac limitation.  No dizziness, presyncope, or syncope.  No lower extremity swelling or progressive orthopnea.  No falls or symptoms concerning for bleeding.   Labs independently reviewed: 06/2024 - TSH normal, A1c 6.3, TC 149, TG 115, HDL 60, LDL 65, potassium 4.9, BUN 13, serum creatinine 0.83, albumin 4.1, AST/ALT normal, Hgb 14.1, PLT 327   Past Medical History:  Diagnosis Date   Acute pain of right shoulder 08/07/2018   Acute shoulder pain 08/07/2018   Cervical dysplasia    COPD (chronic obstructive pulmonary disease) (HCC)    Heart murmur    no longer when she has younger   Hypertension    Mitral valve prolapse    Rheumatic fever    68 years old   Thyroid  disease    Tobacco abuse    Vertigo     Past Surgical History:  Procedure Laterality Date   BREAST BIOPSY Left    August 2023 benign   COLONOSCOPY WITH PROPOFOL  N/A 06/22/2022   Procedure: COLONOSCOPY WITH PROPOFOL ;  Surgeon: Therisa Bi, MD;  Location: [facility] ENDOSCOPY;  Service: Gastroenterology;  Laterality: N/A;   CYSTECTOMY     right brow and buttocks   TONSILLECTOMY  12/05/1970    Current Medications: Current Meds  Medication Sig   aspirin EC 81 MG tablet Take 81 mg by mouth daily. Swallow whole.   atorvastatin  (LIPITOR) 20 MG tablet TAKE 1 TABLET BY MOUTH EVERY DAY IN  THE EVENING FOR CHOLESTEROL   buPROPion  (WELLBUTRIN  SR) 150 MG 12 hr tablet TAKE 1 TABLET (150 MG TOTAL) BY MOUTH DAILY. FOR SMOKING CESSATION   calcium -vitamin D  (OSCAL WITH D) 500-5 MG-MCG tablet Take 1 tablet by mouth.   glucosamine-chondroitin 500-400 MG tablet Take 1 tablet by mouth 3 (three) times daily.   levothyroxine  (SYNTHROID ) 25 MCG tablet TAKE 1 TABLET BY MOUTH EVERY DAY IN THE MORNING EMPTY STONACH NO FOOD 30 MINUTES   Omega-3 Fatty Acids (FISH  OIL PO) Take by mouth.    Allergies:   Patient has no known allergies.   Social History   Socioeconomic History   Marital status: Married    Spouse name: Not on file   Number of children: Not on file   Years of education: Not on file   Highest education level: 12th grade  Occupational History   Occupation: [employer]  Tobacco Use   Smoking status: Former    Current packs/day: 0.00    Average packs/day: 1.5 packs/day for 44.0 years (66.0 ttl pk-yrs)    Types: Cigarettes, Cigars    Start date: 03/06/1975    Quit date: 03/06/2019    Years since quitting: 5.6   Smokeless tobacco: Never   Tobacco comments:    2 cigars a day  Substance and Sexual Activity   Alcohol use: Not Currently   Drug use: No   Sexual activity: Not on file  Other Topics Concern   Not on file  Social History Narrative   Married.   No children.   Moved from [place] last August for job.   Vacations in [place] every year.   Enjoys reading in her free time.   Social Drivers of Corporate Investment Banker Strain: Low Risk  (02/12/2024)   Overall Financial Resource Strain (CARDIA)    Difficulty of Paying Living Expenses: Not hard at all  Food Insecurity: No Food Insecurity (02/12/2024)   Hunger Vital Sign    Worried About Running Out of Food in the Last Year: Never true    Ran Out of Food in the Last Year: Never true  Transportation Needs: No Transportation Needs (02/12/2024)   PRAPARE - Administrator, Civil Service (Medical): No    Lack of Transportation (Non-Medical): No  Physical Activity: Sufficiently Active (02/12/2024)   Exercise Vital Sign    Days of Exercise per Week: 7 days    Minutes of Exercise per Session: 60 min  Stress: No Stress Concern Present (02/12/2024)   [employer] of Occupational Health - Occupational Stress Questionnaire    Feeling of Stress : Not at all  Recent Concern: Stress - Stress Concern Present (01/11/2024)   [employer] of Occupational Health - Occupational  Stress Questionnaire    Feeling of Stress : To some extent  Social Connections: Moderately Isolated (02/12/2024)   Social Connection and Isolation Panel    Frequency of Communication with Friends and Family: More than three times a week    Frequency of Social Gatherings with Friends and Family: More than three times a week    Attends Religious Services: Never    Database Administrator or Organizations: No    Attends Engineer, Structural: Never    Marital Status: Married     Family History:  The patient's family history includes Aortic aneurysm in her mother; Arthritis in her maternal grandmother; Cancer in her father and sister; Dementia in her mother; Glaucoma in her mother; Hypothyroidism in her mother and  sister. There is no history of Breast cancer.  ROS:   12-point review of systems is negative unless otherwise noted in the HPI.   EKGs/Labs/Other Studies Reviewed:    Studies reviewed were summarized above. The additional studies were reviewed today:  Calcium  score 08/29/2024: Ascending Aorta: Normal size   Pericardium: Normal   Coronary arteries: Normal origin of left and right coronary arteries. Distribution of arterial calcifications if present, as noted below;   LM 0   LAD 355   LCx 0   RCA 165   Total 519   IMPRESSION AND RECOMMENDATION: 1. Coronary calcium  score of 519. This was 94th percentile for age and sex matched control. 2. CAC >300 in LAD, RCA. CAC-DRS A3/N3. 3. Recommend aspirin and statin if no contraindication. 4. Recommend cardiology consultation. 5. Continue heart healthy lifestyle and risk factor modification. __________  Myocardial PET/CT 09/19/2024:   LV perfusion is normal. There is no evidence of ischemia. There is no evidence of infarction.   Rest left ventricular function is normal. Rest EF: 67%. Stress left ventricular function is normal. Stress EF: 73%. End diastolic cavity size is normal.   Myocardial blood flow was computed  to be 1.26ml/g/min at rest and 3.76ml/g/min at stress. Global myocardial blood flow reserve was 2.57 and was normal.   Coronary calcium  was present on the attenuation correction CT images. Moderate coronary calcifications were present. Coronary calcifications were present in the left anterior descending artery and right coronary artery distribution(s).   The study is normal. The study is low risk. __________  2D echo 10/25/2024: 1. Left ventricular ejection fraction, by estimation, is 55 to 60%. Left  ventricular ejection fraction by 3D volume is 58 %. The left ventricle has  normal function. Left ventricular endocardial border not optimally defined  to evaluate regional wall  motion. There is mild left ventricular hypertrophy. Left ventricular  diastolic parameters are consistent with Grade I diastolic dysfunction  (impaired relaxation). The average left ventricular global longitudinal  strain is -16.6 %. The global longitudinal   strain is normal.   2. Right ventricular systolic function is normal. The right ventricular  size is normal.   3. The mitral valve is degenerative. Mild mitral valve regurgitation. No  evidence of mitral stenosis. Leaflet tips are calcified, but no obvertly  rheumatic appearance as queried.   4. The aortic valve is tricuspid. There is mild calcification of the  aortic valve. Aortic valve regurgitation is not visualized. Aortic valve  sclerosis/calcification is present, without any evidence of aortic  stenosis.    EKG:  EKG is not ordered today.   Recent Labs: 06/18/2024: ALT 27; BUN 13; Creatinine, Ser 0.83; Hemoglobin 14.1; Platelets 327.0; Potassium 4.9; Sodium 141; TSH 1.27  Recent Lipid Panel    Component Value Date/Time   CHOL 149 06/18/2024 0823   TRIG 115.0 06/18/2024 0823   HDL 60.80 06/18/2024 0823   CHOLHDL 2 06/18/2024 0823   VLDL 23.0 06/18/2024 0823   LDLCALC 65 06/18/2024 0823   LDLDIRECT 129.0 02/16/2016 0842    PHYSICAL EXAM:     VS:  BP 121/84 (BP Location: Left Arm, Patient Position: Sitting, Cuff Size: Normal)   Pulse 96   Ht 5' 7 (1.702 m)   Wt 167 lb (75.8 kg)   SpO2 95%   BMI 26.16 kg/m   BMI: Body mass index is 26.16 kg/m.  Physical Exam Vitals reviewed.  Constitutional:      Appearance: She is well-developed.  HENT:  Head: Normocephalic and atraumatic.  Eyes:     General:        Right eye: No discharge.        Left eye: No discharge.  Cardiovascular:     Rate and Rhythm: Normal rate and regular rhythm.     Pulses:          Posterior tibial pulses are 2+ on the right side and 2+ on the left side.     Heart sounds: Normal heart sounds, S1 normal and S2 normal. Heart sounds not distant. No midsystolic click and no opening snap. No murmur heard.    No friction rub.  Pulmonary:     Effort: Pulmonary effort is normal. No respiratory distress.     Breath sounds: Normal breath sounds. No decreased breath sounds, wheezing, rhonchi or rales.  Musculoskeletal:     Cervical back: Normal range of motion.     Right lower leg: No edema.     Left lower leg: No edema.  Skin:    General: Skin is warm and dry.     Nails: There is no clubbing.  Neurological:     Mental Status: She is alert and oriented to person, place, and time.  Psychiatric:        Speech: Speech normal.        Behavior: Behavior normal.        Thought Content: Thought content normal.        Judgment: Judgment normal.     Wt Readings from Last 3 Encounters:  10/29/24 167 lb (75.8 kg)  09/11/24 166 lb 6.4 oz (75.5 kg)  06/18/24 164 lb (74.4 kg)     ASSESSMENT & PLAN:   CAD involving the native coronary arteries without angina: She is doing well and without symptoms concerning for angina.  Remains active at baseline without cardiac limitation.  Recent myocardial PET/CT without evidence of significant ischemia.  Continue aggressive risk factor modification and primary prevention including aspirin 81 mg and atorvastatin  20  mg.  Rheumatic heart disease: Echo showed mild mitral valve regurgitation with leaflet tips noted to be calcified without overtly rheumatic appearance.  Aortic valve with sclerosis without evidence of insufficiency or stenosis.  No murmur noted on exam today.  No evidence of clinical heart failure.  Monitor with periodic echo.  Low threshold for SBE prophylaxis for dental procedures.  Aortic atherosclerosis: LDL 65 in 06/2024 with normal AST/ALT at that time.  Remains on atorvastatin  20 mg.  HTN: Blood pressure is well-controlled in the office today, not requiring antihypertensive pharmacotherapy at this time.    Disposition: F/u with Dr. Mady or an APP in 6 months.   Medication Adjustments/Labs and Tests Ordered: Current medicines are reviewed at length with the patient today.  Concerns regarding medicines are outlined above. Medication changes, Labs and Tests ordered today are summarized above and listed in the Patient Instructions accessible in Encounters.   Signed, Bernardino Bring, PA-C 10/29/2024 12:53 PM     [facility] [place] [address] [place], [place] [address] [phone]

## 2025-02-12 ENCOUNTER — Ambulatory Visit

## 2025-04-15 ENCOUNTER — Ambulatory Visit: Admitting: Physician Assistant
